# Patient Record
Sex: Male | Born: 1985 | Race: Black or African American | Hispanic: No | Marital: Single
Health system: Southern US, Community
[De-identification: ages and names within clinical notes are randomized; demographics above are authoritative.]

## PROBLEM LIST (undated history)

## (undated) DIAGNOSIS — M5020 Other cervical disc displacement, unspecified cervical region: Secondary | ICD-10-CM

## (undated) DIAGNOSIS — G8929 Other chronic pain: Secondary | ICD-10-CM

## (undated) DIAGNOSIS — M25552 Pain in left hip: Secondary | ICD-10-CM

## (undated) MED ORDER — ERGOCALCIFEROL (VITAMIN D2) 50,000 UNIT CAP
1250 mcg (50,000 unit) | ORAL_CAPSULE | ORAL | Status: DC
Start: ? — End: 2014-01-01

## (undated) MED ORDER — HYDROCODONE-ACETAMINOPHEN 5 MG-325 MG TAB
5-325 mg | ORAL_TABLET | Freq: Four times a day (QID) | ORAL | Status: DC | PRN
Start: ? — End: 2014-01-01

## (undated) MED ORDER — MELOXICAM 15 MG TAB
15 mg | ORAL_TABLET | Freq: Every day | ORAL | Status: DC
Start: ? — End: 2014-01-01

## (undated) MED ORDER — HYDROCODONE-ACETAMINOPHEN 5 MG-325 MG TAB
5-325 mg | ORAL_TABLET | ORAL | Status: DC | PRN
Start: ? — End: 2013-01-26

---

## 2012-02-01 NOTE — Progress Notes (Signed)
[facility] Physical Therapy Evaluation    [name] [name], 26 y.o., male presents today for evaluation.    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 He report pain after BM that lasts the rest of the day. BM 3-4x/day. Pt reports using different types of pain meds, suppositories and "some injections". Nothing has really helped. He currently takes percocet/ oxycodone to help manage the pain prn. Usually he takes daily.    Current urinary complaint  None    Bowel function:  Pain after BM    Pain complaint:  Rectal and anal pain    Pain scale:  Verbal Analog scale: average daily pain 5, best day 3, worst pain 8    Pain description:  daily: constant, after initial BM  worsens with: bowel movement, sitting  improves with: mildy with pain meds      Physical Exam      External trigger point/ muscle tenderness:  Pt with marked guarding, no definitive external TP's but will reassess     Postural assessment:  neutral sitting and standing posture- good alignment    Pelvic floor manual exam:  male-upon anal palpation of the pelvic floor, the following was noted: complete painful guarding response- minimal to no palpation tolerated    Focused with pt on isolation and release of pelvic floor spasm    Pelvic floor MMT  2 - partial elevation    Pelvic muscle release pattern  1 - poor release, no sensation of release    EMG Evaluation of pelvic floor musculature    Not performed at this visit    HEP- pt to use MH daily, begin focused downtraining and Level 1 stretches       Assessment    [name] [name] presents with the following findings for pelvic orthopedic and skeletal findings:  WNL posture, pelvic alignment and hip ROM    [name] [name] presents with the following findings for pelvic floor motor function:  Hypertonic pelvic floor with palpable trigger points, muscle spasm and poor functional motor control  Marked guarding pattern with palpation, likely from painful experiences  or anticipation of pain.      Physical Therapy Goals  ?? Patient education - patient will demonstrate verbal understanding of normal function of pelvic floor musculature, bowel/bladder habits and appropriate dietary and behavioral modifications.  ?? Patient will be independent in home therapeutic exercise program for pelvic muscle strengthening.  ?? Patient to demonstrate within normal limits pelvic floor relaxation for appropriate voiding on EMG - below 1 microvolt during void.  ?? Patient reports decrease of pain by 80- 100%.  ?? Patient will be independent in pain management techniques including appropriate therapeutic exercise, behavioral and dietary modifications and any home modality units.  ?? Patient will return to functional ADL's with decreased or no pain, including exercise, sexual activity and work functions.        Physical Therapy Plan of Treatment    Therapeutic exercise  Neuromotor re-education  Manual therapy  Progressive PME's  Modalities as needed for pain control ([place], Estim, Heat), neuromuscular electrical stimulation  Patient education and home program training  EMG assessment  Biofeedback training      Frequency:  once a week

## 2012-02-15 NOTE — Progress Notes (Signed)
Physical Therapy Treatment Note     Patient: [name] [name]    DOS: 02/15/2012    Subjective    Pt reports:  Sorry about the confusion last week  Was sore after initial visit- no changes other wise    Objective    Manual Therapy    internal trigger point release; soft tissue mobilization; myofascial release; scar massage: noted hypertonicity, painful spasm throughout pelvic floor  response to internal manual therapy: fair response, noted diminished spasm but patient still hypersensitive    Scar tissue and muscle spasm    Worked on contract and relax with focused release- pt to begin submax contractions 2-3 s with long release a few every 2-3 hours       Therapeutic exercise    Happy baby and childs pose        Modalities    moist heat with interferential electrical stimulation to: sacral spine and perineum      Patient education    Pt given home program education re: progressive PME's, pelvic muscle downtraining and "tone checks" daily, progressive therapeutic stretching and use of heat and cold for pain management       Assessment    Anal spasm and decreased tissue extensibility     Plan    Patient will continue with HEP, with above treatment modifications  Continue internal MT with focused release, progress PMEs    Follow-up Disposition: Not on File

## 2012-02-22 NOTE — Progress Notes (Signed)
Physical Therapy Treatment Note     Patient: [name] [name]    DOS: 02/22/2012    Subjective    Pt reports:  no significant changes  Felt a little better post treatment but once he has a BM the pain returns      Objective    Manual Therapy    internal trigger point release; soft tissue mobilization; myofascial release; scar massage: right sided hypertonicity, spasm greater than left  response to internal manual therapy: poor response, tissue remains tight and immobile      Therapeutic exercise    reviewed after modalities        EMG biofeedback/ Pelvic floor exercises    Not performed at this visit        Modalities    moist heat with interferential electrical stimulation to: sacral spine      Patient education    Pt given home program education re: pelvic muscle downtraining and "tone checks" daily       Assessment    good release when focusing during manual therapy     Plan    Patient will continue with HEP    Follow-up Disposition: Not on File

## 2012-05-18 NOTE — Telephone Encounter (Signed)
Patient cancelled appt for 08.19.13 unable to come that date has rsd to 09.16.13.

## 2012-12-17 MED ADMIN — ketorolac tromethamine (TORADOL) 60 mg/2 mL injection 60 mg: INTRAMUSCULAR | @ 10:00:00 | NDC 00409379601

## 2012-12-17 NOTE — ED Provider Notes (Signed)
HPI Comments: 27 yo M presents to the ED c/o left hip pain intermittently for a few months with worsening today.  Pt denies any falls or injury to the left hip.  Pt states that he has never had this pain in the past.  Pt took a tylenol and motrin at 0200.  Pt also states that he has mobic.  Pt denies fever, chills, N/V/D, CP, SOB, ABD pain, back pain, numbness, tingling, and any other sx or complaints.           History reviewed. No pertinent past medical history.     History reviewed. No pertinent past surgical history.      History reviewed. No pertinent family history.     History     Social History   ??? Marital Status: UNKNOWN     Spouse Name: N/A     Number of Children: N/A   ??? Years of Education: N/A     Occupational History   ??? Not on file.     Social History Main Topics   ??? Smoking status: Never Smoker    ??? Smokeless tobacco: Not on file   ??? Alcohol Use: No   ??? Drug Use: No   ??? Sexually Active: Not on file     Other Topics Concern   ??? Not on file     Social History Narrative   ??? No narrative on file                  ALLERGIES: Review of patient's allergies indicates no known allergies.      Review of Systems   Constitutional: Negative.  Negative for fever, chills and diaphoresis.   HENT: Negative.  Negative for ear pain, congestion, sore throat, trouble swallowing and neck pain.    Eyes: Negative.  Negative for photophobia, pain, redness and visual disturbance.   Respiratory: Negative.  Negative for cough, chest tightness, shortness of breath and wheezing.    Cardiovascular: Negative.  Negative for chest pain and palpitations.   Gastrointestinal: Negative.  Negative for nausea, vomiting, abdominal pain, diarrhea and blood in stool.   Genitourinary: Negative for dysuria and frequency.   Musculoskeletal: Negative.  Negative for back pain and joint swelling.        (+) left hip pain.   Skin: Negative.    Neurological: Negative.  Negative for seizures, syncope and headaches.   Psychiatric/Behavioral: Negative.   Negative for behavioral problems and confusion. The patient is not nervous/anxious.    All other systems reviewed and are negative.        Filed Vitals:    12/17/12 0552   BP: 138/98   Pulse: 89   Temp: 98.2 ??F (36.8 ??C)   Resp: 18   Height: 6\' 1"  (1.854 m)   Weight: 100.699 kg (222 lb)   SpO2: 98%            Physical Exam   Nursing note and vitals reviewed.  Constitutional: He appears well-developed and well-nourished. No distress.   HENT:   Head: Normocephalic and atraumatic.   Pulmonary/Chest: No stridor. No respiratory distress.   Musculoskeletal:        Left hip: He exhibits tenderness and bony tenderness. He exhibits normal range of motion, normal strength, no swelling, no crepitus and no deformity.        Lumbar back: He exhibits normal range of motion, no tenderness, no bony tenderness, no swelling, no pain and no spasm.  MDM     Differential Diagnosis; Clinical Impression; Plan:     X-ray negative will treat pain - no back pain      Procedures    -------------------------------------------------------------------------------------------------------------------     EKG INTERPRETATIONS:  None    RESULTS:   6:22 AM  X-RAY FINDINGS  A HIP LEFT AP/LAT MIN 2 VIEW X-Ray was ordered. Dr. Kirke Shaggy, MD's reading of this film is no acute process.       CONSULTATIONS:  None    PROGRESS NOTES:  5:56 AM:  Dr. Kirke Shaggy, MD answered the patient's questions regarding treatment.  6:22 AM:  Dr. Kirke Shaggy, MD reviewed radiology results and medications with the patient. The patient is feeling better. Dr. Kirke Shaggy, MD answered the patient's questions. The patient is ready to be discharged home.            Scribe Attestation:   written ZO:XWRUEAV Horton, (5:58 AM) scribing for and in the presence of Dr.Dasean Brow Helene Kelp, MD  ED Provider (5:58 AM).      I have personally performed the services described in this documentation, reviewed the documentation as recorded by the scribe in my  presence and it accurately and completely records my words and actions.  Lovett Calender, MD    -------------------------------------------------------------------------------------------------------------------    Diagnosis:   1. Left hip pain          Disposition: Home    Follow-up Information    Follow up With Details Comments Contact Info    [facility]  If symptoms worsen           Current Discharge Medication List      START taking these medications    Details   HYDROcodone-acetaminophen (NORCO) 5-325 mg per tablet Take 1 Tab by mouth every four (4) hours as needed for Pain.  Qty: 8 Tab, Refills: 0

## 2012-12-17 NOTE — ED Notes (Signed)
Left hip pain, hurts to walk, sit or lay down.  Pressure type pain.  Denies injury.  Pain has been on and off for a few months just worse today

## 2012-12-17 NOTE — ED Notes (Signed)
Patient armband removed and shredded  Reviewed discharge instructions with pt regarding hip pain; verbalized understanding.

## 2012-12-17 NOTE — ED Notes (Signed)
X-ray with arthritic changes greater trochanter - has coverage through [employer] system - instructed to f/u at [facility]

## 2013-01-10 NOTE — ED Provider Notes (Signed)
[facility]  EMERGENCY DEPARTMENT TREATMENT REPORT  NAME:  [name], [name]  SEX:   M  ADMIT: 01/10/2013  DOB:   [birth year]  MR#    [id number]  ROOM:    TIME SEEN: 12 30 PM  ACCT#  [id number]    cc: Lyna Poser MD, [employer] [facility]     TIME OF EVALUATION:  8:32 a.m.    PRIMARY CARE PHYSICIAN:  [employer].    CHIEF COMPLAINT:  Left hip pain.    HISTORY OF PRESENT ILLNESS:  A 27 year old male atraumatic left hip pain times 2 to 3 weeks.  The patient   states it hurts when he moves the hip in any direction, particularly with   weight-bearing, presents for further evaluation.    REVIEW OF SYSTEMS:  CONSTITUTIONAL:  No fevers.  MUSCULOSKELETAL:  Left hip pain.  No other joint pain or swelling.  No trauma   to the hip.  INTEGUMENTARY:  No rashes.   NEUROLOGICAL:  No headaches, sensory or motor symptoms.      PAST MEDICAL HISTORY:  Denies chronic illness.    SOCIAL HISTORY:  Nonsmoker, no drinking or IV drug use.    FAMILY HISTORY:  Noncontributory.    ALLERGIES:  PENICILLINS.    CURRENT MEDICATIONS:  None.    PHYSICAL EXAMINATION:  VITAL SIGNS:  Blood pressure 127/96, pulse 91, respirations 18, temperature   98.2 orally, pain 8 out of 10, O2 sats 100% on room air.  GENERAL APPEARANCE:  Patient appears well developed and well nourished.    Appearance and behavior are age and situation appropriate.   RESPIRATORY:  Clear and equal breath sounds.  No respiratory distress,   tachypnea, or accessory muscle use.     CARDIOVASCULAR:   Heart regular, without murmurs, gallops, rubs, or thrills.     Dorsalis pedis pulses 2+ and equal.  Calves soft, nontender.  GASTROINTESTINAL:  Abdomen soft, nontender, no complaint of pain to palpation.  MUSCULOSKELETAL:  Completely reproducible tenderness to palpation laterally   over area of greater trochanter of left femur with no anterior or posterior   tenderness in this area.  Range of motion is full without limitations    secondary to pain.  No other tenderness to palpation of lower extremity.  SKIN:  Warm and dry without rashes.   NEUROLOGIC:  Alert, oriented.  Sensation intact, motor strength equal and   symmetric.     INITIAL ASSESSMENT AND MANAGEMENT PLAN:  A 28 year old male with no trauma concerning for fracture, no fevers   concerning for infection.  No neurologic deficits.  I suspect the patient may   have a trochanteric bursitis, less likely would be meralgia paresthetica as   patient denies any history of wearing tight fitting pants.  The patient may   also have tenonitis.  The patient will need to follow up with an orthopedist   for further differentiation.  No evidence of acute abnormality at this time.    The patient is amenable to prescriptions as he needs to be able to drive   himself home today.    DIAGNOSTIC STUDIES:  None.    COURSE:  The patient stable in the Emergency Department, did not develop any other   symptoms, discharged in stable condition.    FINAL DIAGNOSIS:  Hip pain.    DISPOSITION AND TREATMENT PLAN:    Motrin or Percocet for pain.  Follow up with Dr. Lyna Poser for orthopedics.    Work note given.  The patient is  discharged home in stable condition, with   instructions to follow up with their regular doctor.  They are advised to   return immediately for any worsening or symptoms of concern. The patient was   personally evaluated by myself and Dr. Sherlon Handing who agrees with the above   assessment and plan.      ___________________  Wynelle Bourgeois MD  Dictated By: Mearl Latin. Lockie Pares, PA-C    My signature above authenticates this document and my orders, the final   diagnosis (es), discharge prescription (s), and instructions in the [facility]   Pul[facility]ck record.  [facility]  D:01/10/2013  T: 01/10/2013 13:11:03  [id number]  Authenticated by Wynelle Bourgeois, MD On 01/21/2013 02:38:05 AM

## 2013-01-26 NOTE — Patient Instructions (Addendum)
Heat is applied to the outer thigh for 15 to 20 minutes to prepare the area for stretching.     Trochanteric Bursitis: Exercises  Your Care Instructions  Here are some examples of typical rehabilitation exercises for your condition. Start each exercise slowly. Ease off the exercise if you start to have pain.  Your doctor or physical therapist will tell you when you can start these exercises and which ones will work best for you.  How to do the exercises  Hamstring wall stretch    1. Lie on your back in a doorway, with your good leg through the open door.  2. Slide your affected leg up the wall to straighten your knee. You should feel a gentle stretch down the back of your leg.  ?? Do not arch your back.  ?? Do not bend either knee.  ?? Keep one heel touching the floor and the other heel touching the wall. Do not point your toes.  3. Hold the stretch for at least 1 minute to begin. Then try to lengthen the time you hold the stretch to as long as 6 minutes.  4. Repeat 2 to 4 times.  If you do not have a place to do this exercise in a doorway, there is another way to do it:  1. Lie on your back, and bend the knee of your affected leg.  2. Loop a towel under the ball and toes of that foot, and hold the ends of the towel in your hands.  3. Straighten your knee, and slowly pull back on the towel. You should feel a gentle stretch down the back of your leg.  4. Hold the stretch for 15 to 30 seconds. Or even better, hold the stretch for 1 minute if you can.  5. Repeat 2 to 4 times.  Straight-leg raises to the outside    1. Lie on your side, with your affected leg on top.  2. Tighten the front thigh muscles of your top leg to keep your knee straight.  3. Keep your hip and your leg straight in line with the rest of your body, and keep your knee pointing forward. Do not drop your hip back.  4. Lift your top leg straight up toward the ceiling, about 12 inches off the floor. Hold for about 6 seconds, then slowly lower your leg.  5.  Repeat 8 to 12 times.  Clamshell    1. Lie on your side, with your affected leg on top and your head propped on a pillow. Keep your feet and knees together and your knees bent.  2. Raise your top knee, but keep your feet together. Do not let your hips roll back. Your legs should open up like a clamshell.  3. Hold for 6 seconds.  4. Slowly lower your knee back down. Rest for 10 seconds.  5. Repeat 8 to 12 times.  Standing quadriceps stretch    1. If you are not steady on your feet, hold on to a chair, counter, or wall. You can also lie on your stomach or your side to do this exercise.  2. Bend the knee of the leg you want to stretch, and reach behind you to grab the front of your foot or ankle with the hand on the same side. For example, if you are stretching your right leg, use your right hand.  3. Keeping your knees next to each other, pull your foot toward your buttock until you feel a gentle  stretch across the front of your hip and down the front of your thigh. Your knee should be pointed directly to the ground, and not out to the side.  4. Hold the stretch for 15 to 30 seconds.  5. Repeat 2 to 4 times.  Piriformis stretch    1. Lie on your back with your legs straight.  2. Lift your affected leg and bend your knee. With your opposite hand, reach across your body, and then gently pull your knee toward your opposite shoulder.  3. Hold the stretch for 15 to 30 seconds.  4. Repeat 2 to 4 times.  Double knee-to-chest    1. Lie on your back with your knees bent and your feet flat on the floor. You can put a small pillow under your head and neck if it is more comfortable.  2. Bring both knees to your chest.  3. Keep your lower back pressed to the floor. Hold for 15 to 30 seconds.  4. Relax, and lower your knees to the starting position.  5. Repeat 2 to 4 times.  Follow-up care is a key part of your treatment and safety. Be sure to make and go to all appointments, and call your doctor if you are having problems. It's  also a good idea to know your test results and keep a list of the medicines you take.   Where can you learn more?   Go to [email or web address]  Enter N503 in the search box to learn more about "Trochanteric Bursitis: Exercises."   ?? 2006-2014 [employer]. Care instructions adapted under license by [employer] (which disclaims liability or warranty for this information). This care instruction is for use with your licensed healthcare professional. If you have questions about a medical condition or this instruction, always ask your healthcare professional. [employer], [employer] disclaims any warranty or liability for your use of this information.  Content Version: 10.0.270728; Last Revised: October 29, 2010

## 2013-01-26 NOTE — Progress Notes (Signed)
History and Physical    Today's Date:  01/26/2013   Patient's Name: [name] [name]   Patient's DOB:  03-31-1986     History:     Chief Complaint   Patient presents with   ??? Establish Care   ??? Hip Pain     chronic for past 6 months     [name] [name] is a 27 y.o. male presenting for initial visit to establish care.  Primary care PCP is in the [employer] ([facility]). Last seen late last year. Will obtain records from previous provider to review. Care team updated on connect care on specialists seen.     Borderline blood pressure  This is a new problem. BP is borderline. Pt is not taking any bp medicines. Pt doesn't have a h/o hypertension.     Chronic left hip pain   This is a chronic problem. This is not controlled. Pain has been present since December 2013. Pt has been evaluated for this pain at [facility]. Not been seen by ortho. Pain is located in the lateral hip. It is intermittent. When it comes, it's present for three- four days. Pt takes naproxen and tylenol for it. It is not effective.     Past Medical History   Diagnosis Date   ??? Chronic left hip pain 01/26/2013     Past Surgical History   Procedure Laterality Date   ??? Hx gi       hemorrhoidectomy      reports that he has never smoked. He has never used smokeless tobacco. He reports that he does not drink alcohol or use illicit drugs.  Family History   Problem Relation Age of Onset   ??? Arthritis-osteo Mother      hip replacement needed     Allergies   Allergen Reactions   ??? Pcn (Penicillins) Rash     Problem List:      Patient Active Problem List   Diagnosis Code   ??? Chronic left hip pain 719.45, 338.29     Medications:     Current Outpatient Prescriptions   Medication Sig   ??? acetaminophen (TYLENOL) 325 mg tablet Take  by mouth every four (4) hours as needed for Pain.   ??? naproxen (NAPROSYN) 125 mg/5 mL suspension Take 125 mg by mouth two (2) times a day.   ??? HYDROcodone-acetaminophen (NORCO) 5-325 mg per tablet Take 1 Tab by mouth every four (4) hours as needed for Pain.     No  current facility-administered medications for this visit.     Review of Systems:   (Positives in bold)   General:   fevers, chills, generalized weakness, fatigue, weight change, night sweats, appetite change   Neurologic: dizziness, lightheadedness, headaches, loss of consciousness, numbness, tingling, focal weakness, speech change, confusion, memory loss, gait or balance difficulty    Eyes:  vision changes (blurry vision; not seen the eye doctor), double vision, pain with eye movement, photophobia, excessive tearing, dry eyes, redness, discharge  Ears:  change in hearing, ear pain, ear discharge, ear ringing  Nose:  nosebleeds, sneezing, runny nose, nasal congestion  Mouth/Throat: sore throat, voice change, dry mouth, difficulty swallowing  Neck:  pain, stiffness, swelling  Respiratory: dyspnea at rest, dyspnea on exertion, wheezing, cough, sputum production  Cardiovascular:   chest pain, palpitations, orthopnea, PND, pedal edema, leg cramps  Gastrointestinal:  nausea, vomiting, abdominal pain, constipation, diarrhea, heart burn, bloody stools, tarry black stools, rectal pain, hemorrhoids  Urinary: dysuria, hematuria, urinary frequency, nocturia, malodorous urine, difficulty  initiating flow, slow urine stream  Genital (M): penile discharge, ulcerations, rashes, erectile dysfunction  Musculoskeletal:  joint pain, joint stiffness, joint swelling, back pain, neck pain, decreased range of motion, focal muscle pain, diffuse myalgias  Psychiatric: abnormal mood swings, insomnia, anxiety, depression, hallucinations, suicidal ideation, homicidal ideation  Endocrine: polydipsia, polyuria, polyphagia, cold intolerance, heat intolerance, hot flashes, dry skin  Hematologic: easy bruising, easy bleeding  Dermatologic: Itching, rashes    Physical Assessment:   VS:  BP 140/80   Pulse 95   Temp(Src) 99 ??F (37.2 ??C) (Oral)   Resp 16   Ht 6\' 1"  (1.854 m)   Wt 211 lb (95.709 kg)   BMI 27.84 kg/m2  General:   Well-groomed,  well-nourished, in no distress, pleasant, alert, appropriate and conversant.   Eyes:    PERRL, EOMI.    Mouth:  MMM, good dentition, oropharynx WNL without membranes, exudates, petechiae or ulcers  Neck:   Neck supple, no swelling, mass or tenderness, no thyromegaly  Cardiovascular:   No JVD.  RRR, no MRG.    Pulmonary:   Lungs clear bilaterally.  Normal respiratory effort.  Abdomen:   Abdomen soft, NT, ND, NAB.  No masses or organomegaly.  Extremities:   No edema, LEs warm and well-perfused.  Neuro:   Alert and oriented, no focal deficits. No facial asymmetry noted.  Skin:    No rash or jaundice  MSK:   Normal ROM, 5/5 muscle strength, neg SLR test, +lateral hip pain, pain with internal and external rotation of the hip  Psych:  No pressured speech or abnormal thought content    Assessment/Plan & Orders:         ICD-9-CM    1. Routine general medical examination at a health care facility V70.0 CBC WITH AUTOMATED DIFF  Colon cancer: Colonoscopy due at age 3  Dyslipidemia: check fasting lipid panel and CMP  Diabetes mellitus: check FBG  Pneumococcal vaccine: due at age 13   Tdap: unknown  Herpes Zoster vaccine: due at age 55  Hep B vaccine: not indicated   Weight:  Body mass index is 27.84 kg/(m^2). Discussed the patient's BMI with him.  The BMI follow up plan is as follows: improve diet and exercise at least 30 min a day five times a week  Prostate cancer:  Discuss re: screening with PSA between ages 40 and 72   AAA:  One-time abdominal US if current or former smoker aged 72 to 68 years   Osteoporosis:  Check VIt D. No indication for dexa scan.      LIPID PANEL     METABOLIC PANEL, COMPREHENSIVE     THYROID CASCADE PROFILE     VITAMIN D, 25 HYDROXY   2. Chronic left hip pain 719.45 REFERRAL TO ORTHOPEDICS     REFERRAL TO PHYSICAL THERAPY     HYDROcodone-acetaminophen (NORCO) 5-325 mg per tablet     meloxicam (MOBIC) 15 mg tablet        Follow up in 1 month    [name] Sequeira F. Naysha Sholl, MD - Internal Medicine  01/26/2013, 3:49  PM  [facility]  [address] [place], [place] [address]  Phone [phone]  Fax [phone]

## 2013-01-29 NOTE — Telephone Encounter (Signed)
Called patient and told him to stop norco and to use the mobic that was sent to pharmacy. Patient verbalized understanding

## 2013-01-29 NOTE — Telephone Encounter (Signed)
Patient called stating he was having nausea and vomitting from norco and the mobic wasn't sent to pharmacy.

## 2013-01-29 NOTE — Addendum Note (Signed)
Addended by: Jule Economy on: 01/29/2013 08:18 AM     Modules accepted: Orders

## 2013-01-30 LAB — CBC WITH AUTOMATED DIFF
ABS. BASOPHILS: 0 10*3/uL (ref 0.0–0.2)
ABS. EOSINOPHILS: 0.1 10*3/uL (ref 0.0–0.4)
ABS. IMM. GRANS.: 0 10*3/uL (ref 0.0–0.1)
ABS. MONOCYTES: 0.2 10*3/uL (ref 0.1–0.9)
ABS. NEUTROPHILS: 1.9 10*3/uL (ref 1.4–7.0)
Abs Lymphocytes: 1.3 10*3/uL (ref 0.7–3.1)
BASOPHILS: 1 % (ref 0–3)
EOSINOPHILS: 2 % (ref 0–5)
HCT: 47.3 % (ref 37.5–51.0)
HGB: 16.1 g/dL (ref 12.6–17.7)
IMMATURE GRANULOCYTES: 0 % (ref 0–2)
Lymphocytes: 37 % (ref 14–46)
MCH: 31.3 pg (ref 26.6–33.0)
MCHC: 34 g/dL (ref 31.5–35.7)
MCV: 92 fL (ref 79–97)
MONOCYTES: 7 % (ref 4–12)
NEUTROPHILS: 53 % (ref 40–74)
PLATELET: 320 10*3/uL (ref 155–379)
RBC: 5.14 x10E6/uL (ref 4.14–5.80)
RDW: 14.4 % (ref 12.3–15.4)
WBC: 3.6 10*3/uL (ref 3.4–10.8)

## 2013-01-30 LAB — METABOLIC PANEL, COMPREHENSIVE
A-G Ratio: 1.4 (ref 1.1–2.5)
ALT (SGPT): 14 IU/L (ref 0–44)
AST (SGOT): 15 IU/L (ref 0–40)
Albumin: 4.7 g/dL (ref 3.5–5.5)
Alk. phosphatase: 64 IU/L (ref 39–117)
BUN/Creatinine ratio: 8 (ref 8–19)
BUN: 10 mg/dL (ref 6–20)
Bilirubin, total: 0.5 mg/dL (ref 0.0–1.2)
CO2: 23 mmol/L (ref 19–28)
Calcium: 10.1 mg/dL (ref 8.7–10.2)
Chloride: 101 mmol/L (ref 97–108)
Creatinine: 1.2 mg/dL (ref 0.76–1.27)
GFR est AA: 95 mL/min/{1.73_m2} (ref >59)
GFR est non-AA: 82 mL/min/{1.73_m2} (ref >59)
GLOBULIN, TOTAL: 3.4 g/dL (ref 1.5–4.5)
Glucose: 92 mg/dL (ref 65–99)
Potassium: 4.2 mmol/L (ref 3.5–5.2)
Protein, total: 8.1 g/dL (ref 6.0–8.5)
Sodium: 139 mmol/L (ref 134–144)

## 2013-01-30 LAB — LIPID PANEL
Cholesterol, total: 196 mg/dL (ref 100–199)
HDL Cholesterol: 50 mg/dL (ref >39)
LDL, calculated: 122 mg/dL — ABNORMAL HIGH (ref 0–99)
Triglyceride: 118 mg/dL (ref 0–149)
VLDL, calculated: 24 mg/dL (ref 5–40)

## 2013-01-30 LAB — CVD REPORT: PDF IMAGE: 0

## 2013-01-30 LAB — THYROID CASCADE PROFILE: TSH: 0.955 u[IU]/mL (ref 0.450–4.500)

## 2013-01-30 LAB — VITAMIN D, 25 HYDROXY: VITAMIN D, 25-HYDROXY: 17.6 ng/mL — ABNORMAL LOW (ref 30.0–100.0)

## 2013-02-01 NOTE — Progress Notes (Signed)
Quick Note:    Please call pt with results. Put in ergocalciferol 50,000 units qwk x 8 wks for him to pick up at his pharmacy. Thanks.  ______

## 2013-02-02 NOTE — Telephone Encounter (Signed)
Message copied by Lindaann Pascal on Fri Feb 02, 2013  1:28 PM  ------       Message from: Delila Pereyra F       Created: Thu Feb 01, 2013  8:24 AM         Please call pt with results.  Put in ergocalciferol 50,000 units qwk x 8 wks for him to pick up at his pharmacy.  Thanks.  ------

## 2013-02-02 NOTE — Telephone Encounter (Signed)
Called and left message for patient to call. Need tolet patient know that his vit d level low and Dr Pilar Grammes has sent ergocalciferol 50,000u to pharmacy to be taken one tab a week for 8 weeks.

## 2013-02-02 NOTE — ED Provider Notes (Signed)
[facility]  EMERGENCY DEPARTMENT TREATMENT REPORT  NAME:  [name] [name], [name] [name]  SEX:   M  ADMIT: 02/01/2013  DOB:   April 11, 1986  MR#    [id number]  ROOM:    TIME SEEN: 08 36 PM  ACCT#  [id number]        TIME OF SERVICE:  2010    CHIEF COMPLAINT:  Right shoulder pain.    HISTORY OF PRESENT ILLNESS:  The patient is a 27 year old male who presents complaining of right shoulder   and upper back pain which has been ongoing for the last 2 months.  The patient   states 2 months ago he did heavy weightlifting, lifting a 50-pound dumbbell   with his right arm, and states ever since then he has been having pain in his   shoulder and upper back.  He states the pain is exacerbated with movement.  He   did see his primary care physician 2 weeks after the incident and was told to   follow up which he has not done at this point.  He has been taking Tylenol   and Motrin with some relief of his pain.  He denies any numbness or tingling   to his right arm, any associated chest pain, shortness breath, or difficulty   breathing.    REVIEW OF SYSTEMS:  CONSTITUTIONAL:  No fever or chills.  MUSCULOSKELETAL:  Right shoulder and upper back pain status post heavy   lifting.  NEUROLOGIC:  No sensory or motor symptoms.    PAST MEDICAL HISTORY:  No medical illnesses.    CURRENT MEDICATIONS:  None.    KNOWN ALLERGIES:  PENICILLIN.    PHYSICAL EXAMINATION:  VITAL SIGNS:  Blood pressure 141/81, pulse 92, respirations 14, temperature   98.6 orally, pain 9 out of 10, O2 saturation 100% on room air.  GENERAL APPEARANCE:  The patient appears well-developed, well-nourished.  She   is alert, sitting comfortable on the examining bed.  NECK:  Supple, nontender, symmetrical, no masses or JVD, trachea midline,   thyroid not enlarged, nodular, or tender.  Full range of motion intact.  No   midline tenderness noted, no lymphadenopathy.  RESPIRATORY:  Clear and equal breath sounds.  No respiratory distress,   tachypnea, or accessory muscle use.     CARDIOVASCULAR:  Heart regular, without murmurs, gallops, rubs, or thrills.  MUSCULOSKELETAL:  Tenderness to palpation over the trapezius muscle on the   right side.  There is no midline tenderness over cervical, thoracic, lumbar,   sacral spine.  Full range of motion of neck and back are intact.  The patient   is able to flex and extend the right shoulder.  He does complain with   abduction of the right shoulder joint.  There is no other bony tenderness   otherwise along the right upper extremity.  Radial pulses 2+.  Nail beds are   pink with prompt capillary refill.  SKIN:  Area over right upper extremity warm, dry, and intact.  No evidence of   rashes, erythema or edema.  NEUROLOGIC:  Grip strength and light touch sensation of upper extremities   intact and symmetric bilaterally.    INITIAL ASSESSMENT:  A 27 year old male here for evaluation of right shoulder and right upper back   pain.  He does have reproducible tenderness over the right trapezius muscle.    I do suspect acute trapezius strain.  We will discharge the patient home with   Robaxin and naproxen.  I have  encouraged him to follow back up with his   primary care physician.  The patient otherwise had no bony tenderness over his   right upper extremity or over his midline over spine, and do not feel x-rays   are warranted, and he  is neurovascularly intact.    FINAL DIAGNOSES:  1.  Acute shoulder pain.  2.  Acute trapezius strain.    DISPOSITION:  The patient is stable for discharge home at this time.  The patient to follow   up with his primary care physician.  He was discharged home on Robaxin and   naproxen to be taken as directed.  The patient is encouraged to return should   he have any new or worsening symptoms.    The patient was personally evaluated by myself and Dr. Huntley Dec Tsuchitani who   agrees with the above assessment and plan.      ___________________  Tana Conch MD  Dictated By: Truddie Crumble. Cathlyn Parsons, PA-C     My signature above authenticates this document and my orders, the final   diagnosis (es), discharge prescription (s), and instructions in the [facility]   Pulsecheck record.  SD  D:02/01/2013  T: 02/02/2013 08:41:21  [id number]  Authenticated by Tana Conch, MD On 02/14/2013 03:10:18 PM

## 2013-02-02 NOTE — ED Provider Notes (Signed)
[facility]  EMERGENCY DEPARTMENT TREATMENT REPORT  NAME:  [name] [name], [name] [name]  SEX:   M  ADMIT: 02/01/2013  DOB:   [birth year]  MR#    [id number]  ROOM:    TIME SEEN: 08 56 PM  ACCT#  [id number]        ADDENDUM BY ASHLEY BLALOCK, PA-C     ADDENDUM:   Prior to discharge, the patient was requesting something stronger than Robaxin   and naproxen to go home with.  This injury is not a new injury.  It is old.    It has been over 2 months since the injury occurred, and he has been having   pain.  I have reviewed his prescription on the prescription monitoring   program.  He did just recently receive #30 Vicodin on 04/25.  He states the   Vicodin, however, makes him ill; however, he has had multiple Vicodin   prescriptions in the past which he has gotten filled, the most recent one was   he had #28 tablets in March alone.  He also received 16 Percocet at the   beginning of this month.  He has had multiple narcotic prescriptions filled   within the last several months.  I did not feel comfortable prescribing the   patient narcotics at this time and given that this injury is not an acute   injury, it is something that has been ongoing for a few months, I have   encouraged him to follow up with his primary care physician for further   evaluation and pain management.      ___________________  Tana Conch MD  Dictated By: Truddie Crumble. Cathlyn Parsons, PA-C    My signature above authenticates this document and my orders, the final   diagnosis (es), discharge prescription (s), and instructions in the [facility]   Pulsecheck record.  LH  D:02/01/2013  T: 02/02/2013 08:44:12  [id number]  Authenticated by Tana Conch, MD On 02/14/2013 03:10:25 PM

## 2013-02-07 NOTE — Telephone Encounter (Signed)
Left message for patient to call office.

## 2013-06-19 NOTE — Telephone Encounter (Signed)
Pt called wanting to schedule an appointment for his consult for a vas reversal. I told him my next consult appointment as of right now is November 3 at 1:00PM. Patient took that appointment. I told him that we ask for the $150.00 consult fee upfront due to most insurances not paying towards any of the procedure. I told him if he wanted to try to go through his insurance with the consult he would need to get a referral with [employer] prime. Patient stated he has [employer] standard. I told him we would collect the $150.00 but bill his insurance as a curiosity. Pt confirmed. He asked about how long does it take for him to get the procedure scheduled. I told him as of right now Dr. Esmeralda Arthur is out a little while due to the days that he goes to the facility fall on holidays in the upcoming months. I told him I do call [facility] to see if there is an extra spot I can fit a patient in to get the procedure done. Patient asked about the cost. I told him for [place] it is $3,000.00, anesthesia is around $1,200, and [facility] had just changed their price but in all it comes out to around $9,000.00. Patient asked about [employer] covering it. I told him we could call for the benefits, but most insurances do not cover this procedure. He was ok with that. Confirmed the date & time again.

## 2013-07-12 NOTE — Progress Notes (Signed)
Chart reviewed   I agree with physical therapy assessment and plan as outlined    VBrugh

## 2013-07-30 LAB — BLOOD TYPE, (ABO+RH)
ABO/Rh(D): O POS
ABO/Rh: O POS

## 2013-07-30 LAB — CBC WITH AUTOMATED DIFF
BASOPHILS: 0.6 % (ref 0–3)
EOSINOPHILS: 1.7 % (ref 0–5)
HCT: 42.8 % (ref 37.0–50.0)
HGB: 14.9 gm/dl (ref 12.4–17.2)
IMMATURE GRANULOCYTES: 0.9 % (ref 0.0–3.0)
LYMPHOCYTES: 19 % — ABNORMAL LOW (ref 28–48)
MCH: 30.9 pg (ref 23.0–34.6)
MCHC: 34.8 gm/dl (ref 30.0–36.0)
MCV: 88.8 fL (ref 80.0–98.0)
MONOCYTES: 5 % (ref 1–13)
MPV: 10.5 fL — ABNORMAL HIGH (ref 6.0–10.0)
NEUTROPHILS: 72.8 % — ABNORMAL HIGH (ref 34–64)
NRBC: 0 (ref 0–0)
PLATELET: 427 10*3/uL (ref 140–450)
RBC: 4.82 M/uL (ref 3.80–5.70)
RDW-SD: 41.3 (ref 35.1–43.9)
WBC: 5.4 10*3/uL (ref 4.0–11.0)

## 2013-07-30 LAB — METABOLIC PANEL, COMPREHENSIVE
ALT (SGPT): 32 U/L (ref 12–78)
AST (SGOT): 28 U/L (ref 15–37)
Albumin: 4 gm/dl (ref 3.4–5.0)
Alk. phosphatase: 60 U/L (ref 50–136)
BUN: 13 mg/dl (ref 7–25)
Bilirubin, total: 0.5 mg/dl (ref 0.2–1.0)
CO2: 30 mEq/L (ref 21–32)
Calcium: 9.7 mg/dl (ref 8.5–10.1)
Chloride: 103 mEq/L (ref 98–107)
Creatinine: 1.1 mg/dl (ref 0.6–1.3)
GFR est AA: >60
GFR est non-AA: >60
Glucose: 99 mg/dl (ref 74–106)
Potassium: 4.2 mEq/L (ref 3.5–5.1)
Protein, total: 8.5 gm/dl — ABNORMAL HIGH (ref 6.4–8.2)
Sodium: 137 mEq/L (ref 136–145)

## 2013-07-30 LAB — URINALYSIS W/ RFLX MICROSCOPIC
Bilirubin: NEGATIVE
Glucose: NEGATIVE mg/dl
Ketone: NEGATIVE mg/dl
Leukocyte Esterase: NEGATIVE
Nitrites: NEGATIVE
Protein: NEGATIVE mg/dl
Specific gravity: 1.01 (ref 1.005–1.030)
Urobilinogen: 0.2 mg/dl (ref 0.0–1.0)
pH (UA): 8 (ref 5.0–9.0)

## 2013-07-30 LAB — PROTHROMBIN TIME + INR
INR: 1 (ref 0.0–1.1)
Prothrombin time: 13.1 seconds (ref 11.5–14.0)

## 2013-07-30 LAB — ANTIBODY SCREEN: Antibody screen: NEGATIVE

## 2013-07-30 LAB — POC URINE MICROSCOPIC

## 2013-07-30 LAB — BLOOD TYPE CONFIRM.: ABO/Rh(D): O POS

## 2013-07-30 LAB — PTT: aPTT: 31.9 seconds (ref 24.9–35.6)

## 2014-01-01 MED ORDER — IBUPROFEN 800 MG TAB
800 mg | ORAL_TABLET | Freq: Three times a day (TID) | ORAL | Status: AC
Start: 2014-01-01 — End: 2014-01-06

## 2014-01-01 MED ORDER — HYDROCODONE-ACETAMINOPHEN 5 MG-325 MG TAB
5-325 mg | ORAL_TABLET | ORAL | Status: DC
Start: 2014-01-01 — End: 2014-01-02

## 2014-01-01 NOTE — ED Provider Notes (Signed)
Patient is a 28 y.o. male presenting with finger pain. The history is provided by the patient.   Finger Pain   This is a new problem. Pertinent negatives include no numbness.   pt was playing football this morning and jammed his right thumb.  C/o pain particularly on the dorsal aspect at the snuff box and worse w/flexion of the thumb.  Taking Tylenol and Ibuprofen throughout the day.    Denies numbness, weakness.  Left hand dominant.    Denies ETOH, tobacco, illicits.      Past Medical History   Diagnosis Date   ??? 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 Hx gi       hemorrhoidectomy         Family History   Problem Relation Age of Onset   ??? Arthritis-osteo Mother      hip replacement needed        History     Social History   ??? Marital Status: LEGALLY SEPARATED     Spouse Name: N/A     Number of Children: N/A   ??? Years of Education: N/A     Occupational History   ??? Not on file.     Social History Main Topics   ??? Smoking status: Never Smoker    ??? Smokeless tobacco: Never Used   ??? Alcohol Use: No   ??? Drug Use: No   ??? Sexual Activity:     Partners: Female     Pharmacist, hospital Protection: Condom     Other Topics Concern   ??? Not on file     Social History Narrative                  ALLERGIES: Pcn      Review of Systems   Musculoskeletal: Positive for arthralgias.   Skin: Positive for color change.   Neurological: Negative for weakness and numbness.   All other systems reviewed and are negative.      Filed Vitals:    01/01/14 1802   BP: 121/82   Pulse: 100   Temp: 98.8 ??F (37.1 ??C)   Resp: 20   Height: 6\' 1"  (1.854 m)   Weight: 86.183 kg (190 lb)   SpO2: 100%            Physical Exam   Constitutional: Vital signs are normal. He appears well-developed and well-nourished. He is active.  Non-toxic appearance. He does not appear ill. No distress.   HENT:   Head: Normocephalic and atraumatic.   Neck: Normal range of motion. Neck supple. Carotid bruit is not present. No tracheal  deviation present. No thyromegaly present.   Cardiovascular: Normal rate, regular rhythm, normal heart sounds and intact distal pulses.  Exam reveals no gallop and no friction rub.    No murmur heard.  Pulmonary/Chest: Effort normal and breath sounds normal. No stridor. No respiratory distress. He has no wheezes. He has no rales. He exhibits no tenderness.   Abdominal: Soft. He exhibits no distension and no mass. There is no tenderness. There is no rebound, no guarding and no CVA tenderness.   Musculoskeletal: Normal range of motion. He exhibits edema and tenderness.   RUE: FROM of right thumb (abduction and adduction and lateral rotation) but tremendous discomfort with Finkelstein maneuver.  Just proximal to the MCP joint there is TTP at the first MCP.  Hand strength otherwise 5/5.  Cap refill <2 sec.  No other areas of tenderness in the wrist or hand or finger.   Neurological: He is alert.   Skin: Skin is warm, dry and intact. He is not diaphoretic. No pallor.   Psychiatric: He has a normal mood and affect. His speech is normal and behavior is normal. Judgment and thought content normal.   Nursing note and vitals reviewed.       MDM  Number of Diagnoses or Management Options  Tenosynovitis of thumb:   Diagnosis management comments: Differential: tenosynovitis; tendinitis; fracture; sprain; contusion; dislocation    No definite bony injury seen on films.  Splint for comfort.  F/up with hand ortho.    Splint applied by tech to right wrist; excellent position.  N/v intact before and after application.         Amount and/or Complexity of Data Reviewed  Tests in the radiology section of CPT??: ordered and reviewed        Procedures    7:33 PM  Diagnosis:   1. Tenosynovitis of thumb          Disposition: home    Follow-up Information    Follow up With Details Comments Contact Info    Harvest ForestWendy F Alband, MD Schedule an appointment as soon as possible for a visit in 2 days  [address]  [facility]  [place] [id number]  [phone]      Italyhad R Manke, MD Schedule an appointment as soon as possible for a visit in 2 days  [address]                          c  [facility]  [place] [id number]  [phone]      [facility]  If symptoms worsen return immediately [address]  [place] [id number]  [phone]          Current Discharge Medication List      START taking these medications    Details   ibuprofen (MOTRIN) 800 mg tablet Take 1 Tab by mouth every eight (8) hours for 5 days.  Qty: 15 Tab, Refills: 0      HYDROcodone-acetaminophen (NORCO) 5-325 mg per tablet Take 1-2 tablets PO every 4-6 hours as needed for pain control.  If over the counter ibuprofen or acetaminophen was suggested, then only take the vicodin for pain not well controlled with the over the counter medication.  Qty: 12 Tab, Refills: 0

## 2014-01-01 NOTE — ED Notes (Signed)
I have reviewed discharge instructions with the patient.  The patient verbalized understanding. Patient in stable condition at discharge. Patient armband removed and shredded

## 2014-01-01 NOTE — ED Notes (Signed)
PT. C/o R thumb pain - pt. Jammed thumb while playing football this morning.

## 2014-01-02 MED ORDER — PROMETHAZINE 25 MG TAB
25 mg | ORAL_TABLET | Freq: Four times a day (QID) | ORAL | Status: DC | PRN
Start: 2014-01-02 — End: 2014-01-12

## 2014-01-02 MED ORDER — TRAMADOL 50 MG TAB
50 mg | ORAL | Status: AC
Start: 2014-01-02 — End: 2014-01-02

## 2014-01-02 MED ORDER — PROMETHAZINE 25 MG TAB
25 mg | ORAL | Status: AC
Start: 2014-01-02 — End: 2014-01-02

## 2014-01-02 MED ORDER — TRAMADOL 50 MG TAB
50 mg | ORAL_TABLET | Freq: Four times a day (QID) | ORAL | Status: DC | PRN
Start: 2014-01-02 — End: 2014-01-12

## 2014-01-02 MED ADMIN — promethazine (PHENERGAN) 25 mg tablet: ORAL | @ 16:00:00 | NDC 68084015511

## 2014-01-02 MED ADMIN — traMADol (ULTRAM) 50 mg tablet: ORAL | @ 16:00:00 | NDC 51079099101

## 2014-01-02 MED FILL — PROMETHAZINE 25 MG TAB: 25 mg | ORAL | Qty: 1

## 2014-01-02 MED FILL — TRAMADOL 50 MG TAB: 50 mg | ORAL | Qty: 1

## 2014-01-02 NOTE — ED Provider Notes (Signed)
HPI Comments: [name] [name] is a 28 y.o. Male with no pertinent PMHx who presents to the ED c/o medication problem. Pt was here last night after jamming his right thumb. Pt given hydrocodone and motrin for pain. States after taking the hydrocodone he vomited and is not feeling well. Pt requesting different pain medication. Pt denies any SOB, CP, dizziness, sore throat, itching, facial swelling. No other complaints expressed at this time.    Patient is a 28 y.o. male presenting with medication problem.   Medication Problem   Associated symptoms include nausea and vomiting. Pertinent negatives include no confusion and no shortness of breath.        Past Medical History   Diagnosis Date   ??? 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 Hx gi       hemorrhoidectomy         Family History   Problem Relation Age of Onset   ??? Arthritis-osteo Mother      hip replacement needed        History     Social History   ??? Marital Status: LEGALLY SEPARATED     Spouse Name: N/A     Number of Children: N/A   ??? Years of Education: N/A     Occupational History   ??? Not on file.     Social History Main Topics   ??? Smoking status: Never Smoker    ??? Smokeless tobacco: Never Used   ??? Alcohol Use: No   ??? Drug Use: No   ??? Sexual Activity:     Partners: Female     Pharmacist, hospital Protection: Condom     Other Topics Concern   ??? Not on file     Social History Narrative          ALLERGIES: Pcn      Review of Systems   Constitutional: Negative.  Negative for fever, chills and diaphoresis.   HENT: Negative.  Negative for congestion, ear pain, sore throat and trouble swallowing.    Eyes: Negative.  Negative for photophobia, pain, redness and visual disturbance.   Respiratory: Negative.  Negative for cough, chest tightness, shortness of breath and wheezing.    Cardiovascular: Negative.  Negative for chest pain and palpitations.   Gastrointestinal: Positive for nausea and vomiting. Negative for abdominal pain,  diarrhea and blood in stool.   Genitourinary: Negative for dysuria and frequency.   Musculoskeletal: Negative.  Negative for back pain, joint swelling and neck pain.   Skin: Negative.  Negative for rash.   Neurological: Negative.  Negative for seizures, syncope and headaches.   Psychiatric/Behavioral: Negative.  Negative for behavioral problems and confusion. The patient is not nervous/anxious.    All other systems reviewed and are negative.      Filed Vitals:    01/02/14 1142   BP: 146/100   Pulse: 92   Temp: 98.2 ??F (36.8 ??C)   Resp: 14   SpO2: 100%            Physical Exam   Constitutional: He is oriented to person, place, and time. He appears well-developed and well-nourished. No distress.   HENT:   Head: Normocephalic and atraumatic.   Mouth/Throat: Uvula is midline and oropharynx is clear and moist. No oropharyngeal exudate, posterior oropharyngeal edema, posterior oropharyngeal erythema or tonsillar abscesses.   No throat swelling   Eyes: Conjunctivae are normal. Pupils are equal,  round, and reactive to light. No scleral icterus.   Neck: Normal range of motion. Neck supple. No tracheal deviation present.   Cardiovascular: Normal rate, regular rhythm, normal heart sounds and intact distal pulses.    Pulmonary/Chest: Effort normal and breath sounds normal. No respiratory distress. He has no decreased breath sounds. He has no wheezes. He has no rhonchi. He has no rales.   Abdominal: Soft. Bowel sounds are normal. He exhibits no distension. There is no tenderness.   Musculoskeletal: Normal range of motion. He exhibits no edema.   Lymphadenopathy:     He has no cervical adenopathy.   Neurological: He is alert and oriented to person, place, and time. No cranial nerve deficit.   Skin: Skin is warm and dry. No rash noted. He is not diaphoretic.   Psychiatric: He has a normal mood and affect.   Nursing note and vitals reviewed.       MDM  Number of Diagnoses or Management Options  Medication reaction, initial  encounter:   Nausea with vomiting:   Diagnosis management comments: Vomiting after taking norco. Will give dose phenergan and tramadol.    Dc home with Rx phenergan, tramadol. Stop norco.    Risk of Complications, Morbidity, and/or Mortality  Presenting problems: minimal  Diagnostic procedures: minimal  Management options: minimal    Patient Progress  Patient progress: stable      Procedures    -------------------------------------------------------------------------------------------------------------------     EKG INTERPRETATIONS:  none    RADIOLOGY RESULTS:   No orders to display       LABORATORY RESULTS:  No results found for this or any previous visit (from the past 12 hour(s)).    ED Objective Order Summary:     Orders Placed This Encounter   ??? traMADol (ULTRAM) 50 mg tablet     Sig: Take 1 Tab by mouth every six (6) hours as needed for Pain. Max Daily Amount: 200 mg.     Dispense:  12 Tab     Refill:  0   ??? promethazine (PHENERGAN) 25 mg tablet     Sig: Take 1 Tab by mouth every six (6) hours as needed.     Dispense:  12 Tab     Refill:  0       CONSULTATIONS:  none    PROGRESS NOTES:  11:40 AM:  Dr. Ewing SchleinAdrian C Kenyia Wambolt, DO answered the patient's questions regarding treatment. Pt stable and ready for discharge.     DISPOSITION:  ED DIAGNOSIS & DISPOSITION INFORMATION  Diagnosis:   1. Medication reaction, initial encounter    2. Nausea with vomiting          Disposition: HOME    Follow-up Information    Follow up With Details Comments Contact Info    Harvest ForestWendy F Alband, MD Schedule an appointment as soon as possible for a visit in 2 days RETURN TO ER IMMEDIATELY IF ANY WORSENING SYMPTOMS [address]  [facility]  [place] [id number]  [phone]            Current Discharge Medication List      START taking these medications    Details   traMADol (ULTRAM) 50 mg tablet Take 1 Tab by mouth every six (6) hours as needed for Pain. Max Daily Amount: 200 mg.  Qty: 12 Tab, Refills: 0       promethazine (PHENERGAN) 25 mg tablet Take 1 Tab by mouth every six (6) hours as needed.  Qty: 12 Tab, Refills: 0         CONTINUE these medications which have NOT CHANGED    Details   ibuprofen (MOTRIN) 800 mg tablet Take 1 Tab by mouth every eight (8) hours for 5 days.  Qty: 15 Tab, Refills: 0         STOP taking these medications       HYDROcodone-acetaminophen (NORCO) 5-325 mg per tablet Comments:   Reason for Stopping:               SCRIBE ATTESTATION STATEMENT:  Documented by: Edward Qualia scribing for and in the presence of Ewing Schlein, DO.    PROVIDER ATTESTATION STATEMENT:  I personally performed the services described in the documentation, reviewed the documentation, as recorded by the scribe in my presence, and it accurately and completely records my words and actions Ewing Schlein, DO.

## 2014-01-04 NOTE — Progress Notes (Signed)
Writer called patient's listed home phone. Male answered phone stated "No speak English" and hung up.

## 2014-01-08 NOTE — ED Provider Notes (Addendum)
Patient is a 28 y.o. male presenting with dental problem. The history is provided by the patient. History limited by: no communication barrier.   Dental Pain   This is a new problem. The current episode started yesterday. The problem occurs constantly. The problem has not changed since onset.The pain is located in the left upper mouth. The pain is moderate.  Treatments tried: Pt is on 10 mg Percocet - states it is not controlling his pain.  The patient has no cardiac history.       Past Medical History   Diagnosis Date   ??? 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 Hx gi       hemorrhoidectomy         Family History   Problem Relation Age of Onset   ??? Arthritis-osteo Mother      hip replacement needed        History     Social History   ??? Marital Status: LEGALLY SEPARATED     Spouse Name: N/A     Number of Children: N/A   ??? Years of Education: N/A     Occupational History   ??? Not on file.     Social History Main Topics   ??? Smoking status: Never Smoker    ??? Smokeless tobacco: Never Used   ??? Alcohol Use: No   ??? Drug Use: No   ??? Sexual Activity:     Partners: Female     Pharmacist, hospitalBirth Control/ Protection: Condom     Other Topics Concern   ??? Not on file     Social History Narrative                  ALLERGIES: Pcn      Review of Systems   Constitutional: Negative.    HENT: Positive for dental problem.    Eyes: Negative.    Respiratory: Negative.    Cardiovascular: Negative.    Gastrointestinal: Negative.    Endocrine: Negative.    Genitourinary: Negative.    Musculoskeletal: Negative.    Skin: Negative.    Allergic/Immunologic: Negative.    Neurological: Negative.    Psychiatric/Behavioral: Negative.        Filed Vitals:    01/08/14 0618   BP: 140/95   Pulse: 88   Temp: 98.5 ??F (36.9 ??C)   Resp: 16   Height: 6\' 1"  (1.854 m)   Weight: 86.183 kg (190 lb)   SpO2: 99%            Physical Exam   Constitutional: He is oriented to person, place, and time. He appears well-developed and  well-nourished. No distress.   HENT:   Head: Normocephalic and atraumatic.   Teeth 15 and 16 are extracted.  No dry socket bleeding or abscess.     Eyes: Pupils are equal, round, and reactive to light.   Neck: Normal range of motion. Neck supple.   Cardiovascular: Normal rate and regular rhythm.    Pulmonary/Chest: Effort normal and breath sounds normal.   Abdominal: Soft. Bowel sounds are normal. There is no tenderness. There is no rebound.   Genitourinary:   NE   Musculoskeletal: Normal range of motion.   Neurological: He is alert and oriented to person, place, and time.   Skin: Skin is warm and dry.   Psychiatric: He has a normal mood and affect.   Nursing note and vitals  reviewed.       MDM  Number of Diagnoses or Management Options  Elevated blood pressure:   Face pain:   Pain, dental:   Diagnosis management comments: MDM:  Pt states he plans on going back to his dentist, which opens at 1000 today.  Katherine Roan, NP  6:35 AM          Procedures    PROGRESS NOTE:  One or more blood pressure readings were noted elevated during the Pt's presentation in the emergency department this date.  This abnormal reading has been cited in the Pt's diagnosis, and they have been encouraged to follow up with their primary care physician, or referred to a consultant for further evaluation and treatment.    Katherine Roan, NP    Diagnosis:   1. Face pain    2. Pain, dental    3. Elevated blood pressure          Disposition:  Discharged to Home.     Follow-up Information    Follow up With Details Comments Contact Info     Call YOUR DENTIST AS PLANNED.            Patient's Medications   Start Taking    No medications on file   Continue Taking    OXYCODONE-ACETAMINOPHEN (PERCOCET) 10-325 MG PER TABLET    Take 1 Tab by mouth.    PROMETHAZINE (PHENERGAN) 25 MG TABLET    Take 1 Tab by mouth every six (6) hours as needed.    TRAMADOL (ULTRAM) 50 MG TABLET    Take 1 Tab by mouth every six (6) hours as needed for Pain. Max Daily Amount: 200  mg.   These Medications have changed    No medications on file   Stop Taking    No medications on file     I was personally available for consultation in the emergency department.  I have not seen the patient but have reviewed the chart prior to discharge and agree with the documentation recorded by the [facility], including the assessment, treatment plan, and disposition.  Toshiko Kemler Marlene Lard, MD

## 2014-01-08 NOTE — ED Notes (Signed)
Tooth extracted yesterday left upper jaw; pt unable to control the pain with Percocet 10 mg po given by dentist. No swelling noted.

## 2014-01-09 NOTE — Progress Notes (Signed)
Patient has had 3 ER visits in 8 days.  Writer called patient's listed home phone number again. Male voice stated "no..no speak English".  Writer attempted to contact listed emergency contact, "voicemail full".  No further calls scheduled.

## 2014-01-12 NOTE — ED Notes (Signed)
Pt resting on bed in NAD
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
## 2014-01-12 NOTE — ED Notes (Signed)
I have reviewed discharge instructions with the patient.  The patient verbalized understanding.Patient armband removed and shredded

## 2014-01-13 MED ORDER — HYDROCODONE-ACETAMINOPHEN 5 MG-325 MG TAB
5-325 mg | ORAL_TABLET | ORAL | Status: DC
Start: 2014-01-13 — End: 2014-01-18

## 2014-01-13 MED ORDER — GABAPENTIN 300 MG CAP
300 mg | ORAL_CAPSULE | Freq: Three times a day (TID) | ORAL | Status: DC
Start: 2014-01-13 — End: 2014-02-06

## 2014-01-13 MED ORDER — NAPROXEN 500 MG TAB
500 mg | ORAL_TABLET | Freq: Two times a day (BID) | ORAL | Status: AC
Start: 2014-01-13 — End: 2014-01-22

## 2014-01-14 MED ORDER — PREDNISONE 20 MG TAB
20 mg | ORAL_TABLET | Freq: Every day | ORAL | Status: AC
Start: 2014-01-14 — End: 2014-01-19

## 2014-01-14 MED ORDER — KETOROLAC TROMETHAMINE 10 MG TAB
10 mg | ORAL_TABLET | Freq: Three times a day (TID) | ORAL | Status: DC | PRN
Start: 2014-01-14 — End: 2014-02-06
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
## 2014-01-14 NOTE — ED Notes (Signed)
My right arm is still numb and was seen here on Saturday. Dr. Edilia Boickson saw me. I have an appointment on Thursday but I want something done now

## 2014-01-14 NOTE — ED Notes (Signed)
I have reviewed discharge instructions with the patient.  The patient verbalized understanding. Patient armband removed and shredded.
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
## 2014-01-14 NOTE — ED Notes (Signed)
M. Theis, PA at bedside to assess pt.

## 2014-01-18 MED ORDER — HYDROCHLOROTHIAZIDE 25 MG TAB
25 mg | ORAL_TABLET | Freq: Every day | ORAL | Status: DC
Start: 2014-01-18 — End: 2014-02-06

## 2014-01-18 MED ORDER — OXYCODONE-ACETAMINOPHEN 5 MG-325 MG TAB
5-325 mg | ORAL_TABLET | ORAL | Status: DC | PRN
Start: 2014-01-18 — End: 2014-02-06

## 2014-01-18 NOTE — Progress Notes (Signed)
Learning Assessment (baseline): Completed  Depression Screening: Completed  Fall Risk Screening: n/a  Abuse screening: n/a  ADL Assessment: n/a    1. Have you been to the ER, urgent care clinic since your last visit?  Hospitalized since your last visit?No    2. Have you seen or consulted any other health care providers outside of the [facility] since your last visit?  Include any pap smears or colon screening. 03/21/13 MRI CT imaging of cervical spine

## 2014-01-18 NOTE — Progress Notes (Signed)
Internal Medicine Progress Note    Today's Date:  01/21/2014   Patient:  [name] [name]  Patient DOB:  1986-08-23    Subjective:     Chief Complaint   Patient presents with   ??? Neck Pain   ??? Tingling     from righ arm to hand      Hypertension   This is a chronic problem, new to me. BP is not at goal. Pt takes no medication. Pt has had elevated blood pressures on more than one visit.     Neck pain   This is a chronic problem. This is not controlled. Pain was seen for this in the ED. + right radial arm numbness/tingling. MRI shows disk herniation at C6-C7.  Cervical disk fusion is planned in May.  Surgeon is Dr. Lisette Grinderarlson.      Past Medical History   Diagnosis Date   ??? Chronic left hip pain 01/26/2013   ??? Hypertension 01/18/2014   ??? Cervical herniated disc 01/18/2014     Past Surgical History   Procedure Laterality Date   ??? Hx gi       hemorrhoidectomy      reports that he has never smoked. He has never used smokeless tobacco. He reports that he does not drink alcohol or use illicit drugs.  Family History   Problem Relation Age of Onset   ??? Arthritis-osteo Mother      hip replacement needed     Allergies   Allergen Reactions   ??? Pcn [Penicillins] Rash     Review of Systems   Positives in bold  CV:      chest pain, palpitations, orthopnea, PND  PULM: SOB, wheezing, cough, sputum production, hemoptysis    Current Outpatient Meds and Allergies     Current Outpatient Prescriptions on File Prior to Visit   Medication Sig Dispense Refill   ??? ketorolac (TORADOL) 10 mg tablet Take 1 Tab by mouth three (3) times daily as needed for Pain. Do not take for more than 5 days in a row. Max 4 pills per day.  20 Tab  0   ??? gabapentin (NEURONTIN) 300 mg capsule Take 1 Cap by mouth three (3) times daily.  90 Cap  0   ??? naproxen (NAPROSYN) 500 mg tablet Take 1 Tab by mouth two (2) times daily (with meals) for 10 days.  20 Tab  0     No current facility-administered medications on file prior to visit.     Allergies   Allergen Reactions   ??? Pcn  [Penicillins] Rash     Objective:     VS:  BP 146/94   Pulse 90   Temp(Src) 97.9 ??F (36.6 ??C) (Oral)   Resp 16   Ht 6\' 1"  (1.854 m)   Wt 203 lb 9.6 oz (92.352 kg)   BMI 26.87 kg/m2   SpO2 98%  General:   Well-nourished, well-groomed, pleasant, alert, in no acute distress  Head:  Normocephalic, atraumatic  Ears:  External ears WNL  Nose:  External nares WNL  Cardiovascular:   Regular rate and rhythm, no murmurs, no rubs, no gallops  Pulmonary:   Clear breath sounds bilaterally, good air movement, no wheezing, rales or rhonchi, normal respiratory effort  Abdomen:   Abdomen soft, nontender, nondistended, NABS  Extremities:   No edema, warm and well-perfused  Neuro:   Alert, conversant, appropriate, following commands, no focal deficits.   Psych:  No pressured speech, no abnormal thought content  MRI cervical spine: herniation at C6-C7    Assessment/Plan & Orders:         ICD-9-CM    1. Essential hypertension 401.9 hydrochlorothiazide (HYDRODIURIL) 25 mg tablet   2. Cervical herniated disc 722.0 oxyCODONE-acetaminophen (PERCOCET) 5-325 mg per tablet   3. Screening V82.9 METABOLIC PANEL, COMPREHENSIVE     TSH+FREE T4     LIPID PANEL     CBC WITH AUTOMATED DIFF     Follow-up Disposition:  Return in about 1 week (around 01/25/2014) for Blood pressure check, Nurse visit.    *Patient verbalized understanding and agreement with the plan.  Patient was given an after-visit summary.    Fonnie MuWendy F. Clora Ohmer, MD - Internal Medicine  01/21/2014, 1:56 PM  [facility]  [address]  [place], [place] [id number]  Phone [phone]  Fax [phone]

## 2014-01-21 NOTE — Patient Instructions (Signed)
Elevated Blood Pressure: After Your Visit  Your Care Instructions     Blood pressure is a measure of how hard the blood pushes against the walls of your arteries. It's normal for blood pressure to go up and down throughout the day. But if it stays up over time, you have high blood pressure.  Two numbers tell you your blood pressure. The first number is the systolic pressure. It shows how hard the blood pushes when your heart is pumping. The second number is the diastolic pressure. It shows how hard the blood pushes between heartbeats, when your heart is relaxed and filling with blood. An ideal blood pressure in adults is less than 120/80 (say "120 over 80"). High blood pressure is 140/90 or higher.  The main test for high blood pressure is simple, fast, and painless. To diagnose high blood pressure, your doctor will test your blood pressure at different times. You may have to check your blood pressure at home if there is reason to think that the results in the doctor's office aren't accurate.  If you are diagnosed with high blood pressure, you can work with your doctor to make a long-term plan to manage it.  Follow-up care is a key part of your treatment and safety. Be sure to make and go to all appointments, and call your doctor if you are having problems. It's also a good idea to know your test results and keep a list of the medicines you take.  How can you care for yourself at home?  ?? Do not smoke. Smoking increases your risk for heart attack and stroke. If you need help quitting, talk to your doctor about stop-smoking programs and medicines. These can increase your chances of quitting for good.  ?? Stay at a healthy weight.  ?? Try to limit how much sodium you eat to less than 2,300 milligrams (mg) a day. Your doctor may ask you to try to eat less than 1,500 mg a day.  ?? Be physically active. Get at least 30 minutes of exercise on most days of the week. Walking is a good choice. You also may want to do other  activities, such as running, swimming, cycling, or playing tennis or team sports.  ?? Avoid or limit alcohol. Talk to your doctor about whether you can drink any alcohol.  ?? Eat plenty of fruits, vegetables, and low-fat dairy products. Eat less saturated and total fats.  ?? Learn how to check your blood pressure at home.  When should you call for help?  Call your doctor now or seek immediate medical care if:  ?? Your blood pressure is much higher than normal (such as 180/110 or higher).  ?? You think high blood pressure is causing symptoms such as:  ?? Severe headache.  ?? Blurry vision.  Watch closely for changes in your health, and be sure to contact your doctor if:  ?? You do not get better as expected.   Where can you learn more?   Go to http://www.healthwise.net/[facility]  Enter F644 in the search box to learn more about "Elevated Blood Pressure: After Your Visit."   ?? 2006-2015 Healthwise, Incorporated. Care instructions adapted under license by [facility] [place] (which disclaims liability or warranty for this information). This care instruction is for use with your licensed healthcare professional. If you have questions about a medical condition or this instruction, always ask your healthcare professional. Healthwise, Incorporated disclaims any warranty or liability for your use of this information.  Content Version:   10.4.390249; Current as of: December 26, 2012

## 2014-01-23 LAB — METABOLIC PANEL, COMPREHENSIVE
A-G Ratio: 1.5 (ref 1.1–2.5)
ALT (SGPT): 16 IU/L (ref 0–44)
AST (SGOT): 20 IU/L (ref 0–40)
Albumin: 5.1 g/dL (ref 3.5–5.5)
Alk. phosphatase: 53 IU/L (ref 39–117)
BUN/Creatinine ratio: 11 (ref 8–19)
BUN: 13 mg/dL (ref 6–20)
Bilirubin, total: 0.5 mg/dL (ref 0.0–1.2)
CO2: 26 mmol/L (ref 18–29)
Calcium: 10.6 mg/dL — ABNORMAL HIGH (ref 8.7–10.2)
Chloride: 97 mmol/L (ref 97–108)
Creatinine: 1.18 mg/dL (ref 0.76–1.27)
GFR est AA: 97 mL/min/{1.73_m2} (ref >59)
GFR est non-AA: 83 mL/min/{1.73_m2} (ref >59)
GLOBULIN, TOTAL: 3.4 g/dL (ref 1.5–4.5)
Glucose: 88 mg/dL (ref 65–99)
Potassium: 5.1 mmol/L (ref 3.5–5.2)
Protein, total: 8.5 g/dL (ref 6.0–8.5)
Sodium: 142 mmol/L (ref 134–144)

## 2014-01-23 LAB — TSH+FREE T4
Free T4: 1.1 ng/dL
TSH-ICMA: 2.1 uU/mL

## 2014-01-23 LAB — CBC WITH AUTOMATED DIFF
ABS. BASOPHILS: 0 10*3/uL (ref 0.0–0.2)
ABS. EOSINOPHILS: 0.1 10*3/uL (ref 0.0–0.4)
ABS. IMM. GRANS.: 0 10*3/uL (ref 0.0–0.1)
ABS. MONOCYTES: 0.4 10*3/uL (ref 0.1–0.9)
ABS. NEUTROPHILS: 2.8 10*3/uL (ref 1.4–7.0)
Abs Lymphocytes: 1.9 10*3/uL (ref 0.7–3.1)
BASOPHILS: 0 %
EOSINOPHILS: 1 %
HCT: 46.7 % (ref 37.5–51.0)
HGB: 15.5 g/dL (ref 12.6–17.7)
IMMATURE GRANULOCYTES: 0 %
Lymphocytes: 37 %
MCH: 31.3 pg (ref 26.6–33.0)
MCHC: 33.2 g/dL (ref 31.5–35.7)
MCV: 94 fL (ref 79–97)
MONOCYTES: 8 %
NEUTROPHILS: 54 %
PLATELET: 391 10*3/uL — ABNORMAL HIGH (ref 150–379)
RBC: 4.95 x10E6/uL (ref 4.14–5.80)
RDW: 14.5 % (ref 12.3–15.4)
WBC: 5.2 10*3/uL (ref 3.4–10.8)

## 2014-01-23 LAB — LIPID PANEL
Cholesterol, total: 214 mg/dL — ABNORMAL HIGH (ref 100–199)
HDL Cholesterol: 59 mg/dL (ref >39)
LDL, calculated: 127 mg/dL — ABNORMAL HIGH (ref 0–99)
Triglyceride: 139 mg/dL (ref 0–149)
VLDL, calculated: 28 mg/dL (ref 5–40)

## 2014-01-23 LAB — CVD REPORT

## 2014-02-06 NOTE — ED Notes (Signed)
Right upper molar pain x 3 days.  States he has no Education officer, communitydentist.

## 2014-02-06 NOTE — ED Notes (Signed)
Patient (s)  given copy of dc instructions and 3 script(s).  Patient (s)  verbalized understanding of instructions and script (s).  Patient given a current medication reconciliation form and verbalized understanding of their medications.   Patient (s) verbalized understanding of the importance of discussing medications with  his or her physician or clinic they will be following up with.  Patient alert and oriented and in no acute distress.  Patient discharged home ambulatory with self.

## 2014-02-06 NOTE — ED Provider Notes (Signed)
HPI Comments: 8:58 PM  28 y.o. male presents to the ED C/O worsening right upper molar pain onset 4 days ago. Pt reports he is only able to chew on the right side due to left-sided dental extractions, so his teeth have been bothering him for a while. Pt is allergic to Pcn. Pt reports he does not have a dentist because he just got out of the [employer]. Pt denies gum drainage, and any other sxs or complaints at this time.    Patient is a 28 y.o. male presenting with dental problem. The history is provided by the patient. No language interpreter was used.   Dental Pain   This is a new problem. The current episode started more than 2 days ago (4 days ago). The problem has been gradually worsening. The pain is located in the right upper mouth. There was no vomiting, no nausea, no swelling, no gum redness and no drainage. He has tried nothing for the symptoms.   Written By Cipriano Mile, ED Scribe, as dictated by Romero Liner, PA-C.       Past Medical History   Diagnosis Date   ??? Chronic left hip pain 01/26/2013   ??? Hypertension 01/18/2014   ??? Cervical herniated disc 01/18/2014        Past Surgical History   Procedure Laterality Date   ??? Hx gi       hemorrhoidectomy         Family History   Problem Relation Age of Onset   ??? Arthritis-osteo Mother      hip replacement needed        History     Social History   ??? Marital Status: MARRIED     Spouse Name: N/A     Number of Children: N/A   ??? Years of Education: N/A     Occupational History   ??? Not on file.     Social History Main Topics   ??? Smoking status: Never Smoker    ??? Smokeless tobacco: Never Used   ??? Alcohol Use: No   ??? Drug Use: No   ??? Sexual Activity:     Partners: Female     Pharmacist, hospital Protection: Condom     Other Topics Concern   ??? Not on file     Social History Narrative                  ALLERGIES: Pcn      Review of Systems   Constitutional: Negative for fever and fatigue.   HENT: Positive for dental problem. Negative for rhinorrhea and sore throat.     Respiratory: Negative for cough and shortness of breath.    Cardiovascular: Negative for chest pain and palpitations.   Gastrointestinal: Negative for nausea, vomiting, abdominal pain and diarrhea.   Genitourinary: Negative for dysuria and difficulty urinating.   Musculoskeletal: Negative for myalgias and arthralgias.   Skin: Negative for color change and rash.   Neurological: Negative for light-headedness and headaches.   All other systems reviewed and are negative.      Filed Vitals:    02/06/14 2056   BP: 150/101   Pulse: 111   Temp: 98.1 ??F (36.7 ??C)   Resp: 18   Height: 6\' 1"  (1.854 m)   Weight: 92.534 kg (204 lb)   SpO2: 100%            Physical Exam   Constitutional: He appears well-developed and well-nourished. No distress.   HENT:  Head: Normocephalic and atraumatic.   Right Ear: Tympanic membrane, external ear and ear canal normal.   Left Ear: Tympanic membrane, external ear and ear canal normal.   Nose: Nose normal.   Mouth/Throat: Uvula is midline, oropharynx is clear and moist and mucous membranes are normal. Abnormal dentition. No dental abscesses or dental caries.       Eyes: Conjunctivae and EOM are normal. Pupils are equal, round, and reactive to light.   Neck: Trachea normal, normal range of motion and full passive range of motion without pain. Neck supple.   Cardiovascular: Normal rate, regular rhythm, normal heart sounds and normal pulses.    Pulmonary/Chest: Effort normal and breath sounds normal.   Abdominal: Soft. Normal appearance and bowel sounds are normal. There is no tenderness. There is no rebound and no guarding.   Musculoskeletal: Normal range of motion.   Neurological: He is alert. He has normal strength and normal reflexes.   Skin: Skin is warm and dry. He is not diaphoretic.   Psychiatric: He has a normal mood and affect. His speech is normal and behavior is normal. Judgment normal.   Nursing note and vitals reviewed.       RESULTS    Labs Reviewed - No data to display     No  results found for this or any previous visit (from the past 12 hour(s)).    No orders to display        MDM  Number of Diagnoses or Management Options     Amount and/or Complexity of Data Reviewed  Decide to obtain previous medical records or to obtain history from someone other than the patient: yes (PMP)  Review and summarize past medical records: yes      MEDICATIONS GIVEN:  Medications - No data to display     Procedures    PROGRESS NOTE:  8:58 PM   Initial assessment performed.  Recorded by Erik ObeyErin H Collins, as dictated by Romero Linerobert Matheus Spiker, PA-C.     Discharge Note:  Cheri RousCurtis Murgia's  results have been reviewed with him.  He has been counseled regarding his diagnosis, treatment, and plan.  He verbally conveys understanding and agreement of the signs, symptoms, diagnosis, treatment and prognosis and additionally agrees to follow up as discussed.  He also agrees with the care-plan and conveys that all of his questions have been answered.  I have also provided discharge instructions for him that include: educational information regarding their diagnosis and treatment, and list of reasons why they would want to return to the ED prior to their follow-up appointment, should his condition change.      CLINICAL IMPRESSION    1. Pain, dental        After Visit Plan:  D/c home    Current Discharge Medication List      START taking these medications    Details   indomethacin (INDOCIN) 25 mg capsule Take 2 Caps by mouth three (3) times daily for 10 days.  Qty: 60 Cap, Refills: 0      lidocaine (LIDOCAINE VISCOUS) 2 % solution Take 5 mL by mouth four (4) times daily as needed for Pain. Mix with equal parts water.  Swish and swallow.  Qty: 1 Bottle, Refills: 0      clindamycin (CLEOCIN) 300 mg capsule Take 1 Cap by mouth four (4) times daily for 7 days.  Qty: 28 Cap, Refills: 0              Follow-up Information  Follow up With Details Comments Contact Info    Dentist Call follow up with a dentist ASAP choose from list below     [facility]  As needed, If symptoms worsen [address]  [place] [id number]  [phone]           Recorded by Cipriano MileErin Collins, ED Scribe, as dictated by Romero Linerobert Irving Lubbers, PA-C

## 2014-02-07 MED ORDER — LIDOCAINE 2 % MUCOSAL SOLN
2 % | Freq: Four times a day (QID) | Status: DC | PRN
Start: 2014-02-07 — End: 2014-03-22

## 2014-02-07 MED ORDER — CLINDAMYCIN 300 MG CAP
300 mg | ORAL_CAPSULE | Freq: Four times a day (QID) | ORAL | Status: AC
Start: 2014-02-07 — End: 2014-02-13

## 2014-02-07 MED ORDER — INDOMETHACIN 25 MG CAP
25 mg | ORAL_CAPSULE | Freq: Three times a day (TID) | ORAL | Status: AC
Start: 2014-02-07 — End: 2014-02-16

## 2014-02-07 NOTE — Progress Notes (Signed)
Writer attempted to contact patient via phone. Someone answered phone and hung up. Writer called back and left message for patient to Regulatory affairs officercall writer at earliest convenience.

## 2014-02-08 NOTE — Progress Notes (Signed)
Writer left voicemail on patient's listed contact number to Higher education careers advisercontact writer at [employer] Incearliest convenience.
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
## 2014-02-20 ENCOUNTER — Inpatient Hospital Stay

## 2014-02-20 NOTE — Interval H&P Note (Signed)
H&P Update:  Bernette RedbirdCurtis Baby was seen and examined.  History and physical has been reviewed. There have been no significant clinical changes since the completion of the originally dated History and Physical.    Signed By: Dixon BoosJeffrey R Sofie Schendel, MD     Feb 20, 2014 8:41 AM

## 2014-03-07 MED ORDER — OXYCODONE-ACETAMINOPHEN 5 MG-325 MG TAB
5-325 mg | ORAL_TABLET | ORAL | Status: DC | PRN
Start: 2014-03-07 — End: 2014-03-22

## 2014-03-07 NOTE — Patient Instructions (Signed)
DASH Diet: After Your Visit  Your Care Instructions  The DASH diet is an eating plan that can help lower your blood pressure. DASH stands for Dietary Approaches to Stop Hypertension. Hypertension is high blood pressure.  The DASH diet focuses on eating foods that are high in calcium, potassium, and magnesium. These nutrients can lower blood pressure. The foods that are highest in these nutrients are fruits, vegetables, low-fat dairy products, nuts, seeds, and legumes. But taking calcium, potassium, and magnesium supplements instead of eating foods that are high in those nutrients does not have the same effect. The DASH diet also includes whole grains, fish, and poultry.  The DASH diet is one of several lifestyle changes your doctor may recommend to lower your high blood pressure. Your doctor may also want you to decrease the amount of sodium in your diet. Lowering sodium while following the DASH diet can lower blood pressure even further than just the DASH diet alone.  Follow-up care is a key part of your treatment and safety. Be sure to make and go to all appointments, and call your doctor if you are having problems. It's also a good idea to know your test results and keep a list of the medicines you take.  How can you care for yourself at home?  Following the DASH diet  ?? Eat 4 to 5 servings of fruit each day. A serving is 1 medium-sized piece of fruit, ?? cup chopped or canned fruit, 1/4 cup dried fruit, or 4 ounces (?? cup) of fruit juice. Choose fruit more often than fruit juice.  ?? Eat 4 to 5 servings of vegetables each day. A serving is 1 cup of lettuce or raw leafy vegetables, ?? cup of chopped or cooked vegetables, or 4 ounces (?? cup) of vegetable juice. Choose vegetables more often than vegetable juice.  ?? Get 2 to 3 servings of low-fat and fat-free dairy each day. A serving is 8 ounces of milk, 1 cup of yogurt, or 1 ?? ounces of cheese.  ?? Eat 6 to 8 servings of grains each day. A serving is 1 slice of  bread, 1 ounce of dry cereal, or ?? cup of cooked rice, pasta, or cooked cereal. Try to choose whole-grain products as much as possible.  ?? Limit lean meat, poultry, and fish to 2 servings each day. A serving is 3 ounces, about the size of a deck of cards.  ?? Eat 4 to 5 servings of nuts, seeds, and legumes (cooked dried beans, lentils, and split peas) each week. A serving is 1/3 cup of nuts, 2 tablespoons of seeds, or ?? cup cooked dried beans or peas.  ?? Limit fats and oils to 2 to 3 servings each day. A serving is 1 teaspoon of vegetable oil or 2 tablespoons of salad dressing.  ?? Limit sweets and added sugars to 5 servings or less a week. A serving is 1 tablespoon jelly or jam, ?? cup sorbet, or 1 cup of lemonade.  ?? Eat less than 2,300 milligrams (mg) of sodium a day. If you have high blood pressure, diabetes, or chronic kidney disease, if you are African-American, or if you are older than age 50, try to limit the amount of sodium you eat to less than 1,500 mg a day.  Tips for success  ?? Start small. Do not try to make dramatic changes to your diet all at once. You might feel that you are missing out on your favorite foods and then be more   likely to not follow the plan. Make small changes, and stick with them. Once those changes become habit, add a few more changes.  ?? Try some of the following:  ?? Make it a goal to eat a fruit or vegetable at every meal and at snacks. This will make it easy to get the recommended amount of fruits and vegetables each day.  ?? Try yogurt topped with fruit and nuts for a snack or healthy dessert.  ?? Add lettuce, tomato, cucumber, and onion to sandwiches.  ?? Combine a ready-made pizza crust with low-fat mozzarella cheese and lots of vegetable toppings. Try using tomatoes, squash, spinach, broccoli, carrots, cauliflower, and onions.  ?? Have a variety of cut-up vegetables with a low-fat dip as an appetizer instead of chips and dip.  ?? Sprinkle sunflower seeds or chopped almonds over  salads. Or try adding chopped walnuts or almonds to cooked vegetables.  ?? Try some vegetarian meals using beans and peas. Add garbanzo or kidney beans to salads. Make burritos and tacos with mashed pinto beans or black beans.   Where can you learn more?   Go to http://www.healthwise.net/[facility]  Enter H967 in the search box to learn more about "DASH Diet: After Your Visit."   ?? 2006-2015 Healthwise, Incorporated. Care instructions adapted under license by [employer] (which disclaims liability or warranty for this information). This care instruction is for use with your licensed healthcare professional. If you have questions about a medical condition or this instruction, always ask your healthcare professional. Healthwise, Incorporated disclaims any warranty or liability for your use of this information.  Content Version: 10.4.390249; Current as of: December 13, 2012

## 2014-03-07 NOTE — Progress Notes (Signed)
Internal Medicine Progress Note    Today's Date:  03/07/2014   Patient:  [name] [name]  Patient DOB:  [birth year]    Subjective:     Chief Complaint   Patient presents with   ??? Follow Up Chronic Condition   ??? Neck Pain   ??? Hypertension      Hypertension   This is a chronic problem. BP is not at goal/borderline. Pt takes no medication. Pt was prescribed HCTZ and picked it up. He has not started taking it.      Neck pain  This is a chronic problem. This is not controlled. +right radial arm numbness/tingling. MRI shows disk herniation at C6-C7.  Cervical disk fusion was planned in May with surgeon is Dr. Lisette Grinder.  However, tricare wasn't able to cover it. He is a pending Personal assistant.      Past Medical History   Diagnosis Date   ??? Chronic left hip pain 01/26/2013   ??? Hypertension 01/18/2014   ??? Cervical herniated disc 01/18/2014     Past Surgical History   Procedure Laterality Date   ??? Hx gi       hemorrhoidectomy      reports that he has never smoked. He has never used smokeless tobacco. He reports that he does not drink alcohol or use illicit drugs.  Family History   Problem Relation Age of Onset   ??? Arthritis-osteo Mother      hip replacement needed     Allergies   Allergen Reactions   ??? Pcn [Penicillins] Rash     Review of Systems   Positives in bold  CV:      chest pain, palpitations  PULM: SOB, wheezing, cough, sputum production    Current Outpatient Meds and Allergies     Current Outpatient Prescriptions on File Prior to Visit   Medication Sig Dispense Refill   ??? lidocaine (LIDOCAINE VISCOUS) 2 % solution Take 5 mL by mouth four (4) times daily as needed for Pain. Mix with equal parts water.  Swish and swallow.  1 Bottle  0     No current facility-administered medications on file prior to visit.     Allergies   Allergen Reactions   ??? Pcn [Penicillins] Rash     Objective:     VS:  BP 136/80   Pulse 90   Temp(Src) 98.9 ??F (37.2 ??C) (Oral)   Resp 18   Ht 6\' 1"  (1.854 m)   Wt 209 lb (94.802 kg)   BMI 27.58 kg/m2    SpO2 100%  General:   Well-nourished, well-groomed, pleasant, alert, in no acute distress  Head:  Normocephalic, atraumatic  Ears:  External ears WNL  Nose:  External nares WNL  Neuro:   Alert, conversant, appropriate, following commands  Psych:  No pressured speech, no abnormal thought content  MSK:  4/5 strength in right UE    MRI cervical spine: herniation at C6-C7    Assessment/Plan & Orders:         ICD-9-CM    1. Essential hypertension 401.9    2. Cervical herniated disc 722.0 oxyCODONE-acetaminophen (PERCOCET) 5-325 mg per tablet     Pt told to start taking HCTZ 25 mg po daily    Follow-up Disposition:  Return in about 4 weeks (around 04/04/2014) for Follow up.    *Patient verbalized understanding and agreement with the plan.  Patient was given an after-visit summary.    Fonnie Mu. Jazman Reuter, MD - Internal Medicine  03/07/2014, 1:56  PM  [facility]  [address] [place] [address]  Phone [phone]  Fax [phone]

## 2014-03-07 NOTE — Progress Notes (Signed)
[name] [name] is a 28 y.o. male presents today for follow up for BP.  He is fasting now.  PHQ does not require further attention.  Pt is in Room # 2        Learning Assessment (baseline): Completed  Depression Screening: Completed    1. Have you been to the ER, urgent care clinic since your last visit?  Hospitalized since your last visit?No    2. Have you seen or consulted any other health care providers outside of the [facility] since your last visit?  Include any pap smears or colon screening. No

## 2014-03-21 ENCOUNTER — Encounter

## 2014-03-21 NOTE — Telephone Encounter (Signed)
Patient would like refill of the above medication he has been taking 2 a day not 1 every 4 hours.

## 2014-03-22 MED ORDER — CYCLOBENZAPRINE 10 MG TAB
10 mg | ORAL_TABLET | Freq: Three times a day (TID) | ORAL | Status: AC | PRN
Start: 2014-03-22 — End: ?

## 2014-03-22 MED ORDER — CYCLOBENZAPRINE 5 MG TAB
5 mg | ORAL_TABLET | Freq: Three times a day (TID) | ORAL | Status: DC | PRN
Start: 2014-03-22 — End: 2014-03-22

## 2014-03-22 MED ORDER — LISINOPRIL 20 MG TAB
20 mg | ORAL_TABLET | Freq: Every day | ORAL | Status: AC
Start: 2014-03-22 — End: ?

## 2014-03-22 MED ORDER — OXYCODONE-ACETAMINOPHEN 5 MG-325 MG TAB
5-325 mg | ORAL_TABLET | ORAL | Status: DC | PRN
Start: 2014-03-22 — End: 2014-04-10

## 2014-03-22 NOTE — Progress Notes (Signed)
Bernette RedbirdCurtis Mccreery is a 28 y.o. male presents today for follow up for hip pain and rt shoulder pain. BP recheck.  He is fasting now.  PHQ does not require further attention.  Pt is in Room # 2      Learning Assessment (baseline): Completed  Depression Screening: Completed    1. Have you been to the ER, urgent care clinic since your last visit?  Hospitalized since your last visit?No    2. Have you seen or consulted any other health care providers outside of the [facility] since your last visit?  Include any pap smears or colon screening. No

## 2014-03-22 NOTE — Patient Instructions (Signed)
DASH Diet: After Your Visit  Your Care Instructions  The DASH diet is an eating plan that can help lower your blood pressure. DASH stands for Dietary Approaches to Stop Hypertension. Hypertension is high blood pressure.  The DASH diet focuses on eating foods that are high in calcium, potassium, and magnesium. These nutrients can lower blood pressure. The foods that are highest in these nutrients are fruits, vegetables, low-fat dairy products, nuts, seeds, and legumes. But taking calcium, potassium, and magnesium supplements instead of eating foods that are high in those nutrients does not have the same effect. The DASH diet also includes whole grains, fish, and poultry.  The DASH diet is one of several lifestyle changes your doctor may recommend to lower your high blood pressure. Your doctor may also want you to decrease the amount of sodium in your diet. Lowering sodium while following the DASH diet can lower blood pressure even further than just the DASH diet alone.  Follow-up care is a key part of your treatment and safety. Be sure to make and go to all appointments, and call your doctor if you are having problems. It's also a good idea to know your test results and keep a list of the medicines you take.  How can you care for yourself at home?  Following the DASH diet  ?? Eat 4 to 5 servings of fruit each day. A serving is 1 medium-sized piece of fruit, ?? cup chopped or canned fruit, 1/4 cup dried fruit, or 4 ounces (?? cup) of fruit juice. Choose fruit more often than fruit juice.  ?? Eat 4 to 5 servings of vegetables each day. A serving is 1 cup of lettuce or raw leafy vegetables, ?? cup of chopped or cooked vegetables, or 4 ounces (?? cup) of vegetable juice. Choose vegetables more often than vegetable juice.  ?? Get 2 to 3 servings of low-fat and fat-free dairy each day. A serving is 8 ounces of milk, 1 cup of yogurt, or 1 ?? ounces of cheese.  ?? Eat 6 to 8 servings of grains each day. A serving is 1 slice of  bread, 1 ounce of dry cereal, or ?? cup of cooked rice, pasta, or cooked cereal. Try to choose whole-grain products as much as possible.  ?? Limit lean meat, poultry, and fish to 2 servings each day. A serving is 3 ounces, about the size of a deck of cards.  ?? Eat 4 to 5 servings of nuts, seeds, and legumes (cooked dried beans, lentils, and split peas) each week. A serving is 1/3 cup of nuts, 2 tablespoons of seeds, or ?? cup of cooked beans or peas.  ?? Limit fats and oils to 2 to 3 servings each day. A serving is 1 teaspoon of vegetable oil or 2 tablespoons of salad dressing.  ?? Limit sweets and added sugars to 5 servings or less a week. A serving is 1 tablespoon jelly or jam, ?? cup sorbet, or 1 cup of lemonade.  ?? Eat less than 2,300 milligrams (mg) of sodium a day. If you have high blood pressure, diabetes, or chronic kidney disease, if you are African-American, or if you are older than age 50, try to limit the amount of sodium you eat to less than 1,500 mg a day.  Tips for success  ?? Start small. Do not try to make dramatic changes to your diet all at once. You might feel that you are missing out on your favorite foods and then be more   likely to not follow the plan. Make small changes, and stick with them. Once those changes become habit, add a few more changes.  ?? Try some of the following:  ?? Make it a goal to eat a fruit or vegetable at every meal and at snacks. This will make it easy to get the recommended amount of fruits and vegetables each day.  ?? Try yogurt topped with fruit and nuts for a snack or healthy dessert.  ?? Add lettuce, tomato, cucumber, and onion to sandwiches.  ?? Combine a ready-made pizza crust with low-fat mozzarella cheese and lots of vegetable toppings. Try using tomatoes, squash, spinach, broccoli, carrots, cauliflower, and onions.  ?? Have a variety of cut-up vegetables with a low-fat dip as an appetizer instead of chips and dip.  ?? Sprinkle sunflower seeds or chopped almonds over salads.  Or try adding chopped walnuts or almonds to cooked vegetables.  ?? Try some vegetarian meals using beans and peas. Add garbanzo or kidney beans to salads. Make burritos and tacos with mashed pinto beans or black beans.   Where can you learn more?   Go to http://www.healthwise.net/[facility]  Enter H967 in the search box to learn more about "DASH Diet: After Your Visit."   ?? 2006-2015 Healthwise, Incorporated. Care instructions adapted under license by [employer] (which disclaims liability or warranty for this information). This care instruction is for use with your licensed healthcare professional. If you have questions about a medical condition or this instruction, always ask your healthcare professional. Healthwise, Incorporated disclaims any warranty or liability for your use of this information.  Content Version: 10.5.422740; Current as of: November 23, 2013

## 2014-03-22 NOTE — Progress Notes (Signed)
Internal Medicine Progress Note    Today's Date:  03/22/2014   Patient:  [name] [name]  Patient DOB:  Jan 03, 1986    Subjective:     Chief Complaint   Patient presents with   ??? Hip Pain      Hypertension   This is a chronic problem. BP is not at goal/borderline. Pt takes HCTZ. He reports feeling nausea and abdominal pain with this medication.      Neck pain  This is a chronic problem. This is not controlled. +right radial arm numbness/tingling. MRI shows disk herniation at C6-C7.  Cervical disk fusion was planned in May with surgeon is Dr. Lisette Grinderarlson.  However, [employer] wasn't able to cover it. He [place] a [place] and [place].      Chronic left hip pain   This is a chronic problem. This is not controlled. Pain has been present since December 2013. Pain is located in the lateral hip. It is intermittent. When it comes, it's present for three- four days. Pt takes naproxen and tylenol for it. It is not effective.     Past Medical History   Diagnosis Date   ??? Chronic left hip pain 01/26/2013   ??? Hypertension 01/18/2014   ??? Cervical herniated disc 01/18/2014     Past Surgical History   Procedure Laterality Date   ??? Hx gi       hemorrhoidectomy      reports that he has never smoked. He has never used smokeless tobacco. He reports that he does not drink alcohol or use illicit drugs.  Family History   Problem Relation Age of Onset   ??? Arthritis-osteo Mother      hip replacement needed     Allergies   Allergen Reactions   ??? Pcn [Penicillins] Rash     Review of Systems   Positives in bold  CV:      chest pain, palpitations  PULM: SOB, wheezing, cough, sputum production    Current Outpatient Meds and Allergies     No current outpatient prescriptions on file prior to visit.     No current facility-administered medications on file prior to visit.     Allergies   Allergen Reactions   ??? Pcn [Penicillins] Rash     Objective:     VS:  BP 140/100 mmHg   Pulse 98   Temp(Src) 98.2 ??F (36.8 ??C) (Oral)   Resp 16   Ht 6\' 1"  (1.854 m)   Wt  209 lb (94.802 kg)   BMI 27.58 kg/m2   SpO2 97%  General:   Well-nourished, well-groomed, pleasant, alert, in no acute distress  Head:  Normocephalic, atraumatic  Ears:  External ears WNL  Nose:  External nares WNL  Neuro:   Alert, conversant, appropriate, following commands  Psych:  No pressured speech, no abnormal thought content  MSK:  Decreased cervical ROM, 5/5 UE muscle strength  Left hip:  +lateral hip TTP, +pain with internal and external rotation    Assessment/Plan & Orders:         ICD-9-CM    1. Essential hypertension 401.9 lisinopril (PRINIVIL, ZESTRIL) 20 mg tablet   2. Chronic left hip pain 719.45 REFERRAL TO ORTHOPEDIC SURGERY    338.29 oxyCODONE-acetaminophen (PERCOCET) 5-325 mg per tablet     cyclobenzaprine (FLEXERIL) 10 mg tablet   3. Cervical herniated disc 722.0 REFERRAL TO ORTHOPEDIC SURGERY     oxyCODONE-acetaminophen (PERCOCET) 5-325 mg per tablet     cyclobenzaprine (FLEXERIL) 10 mg tablet  Pt told to start taking HCTZ 25 mg po daily  May benefit from steroid injection to left hip    Follow-up Disposition:  Return in about 3 months (around 06/22/2014) for Follow up.    *Patient verbalized understanding and agreement with the plan.  Patient was given an after-visit summary.    Fonnie MuWendy F. Genola Yuille, MD - Internal Medicine  03/22/2014, 1:56 PM  [facility]  [address]  [place], [place] [id number]  Phone [phone]  Fax [phone]

## 2014-04-10 ENCOUNTER — Encounter

## 2014-04-11 MED ORDER — OXYCODONE-ACETAMINOPHEN 5 MG-325 MG TAB
5-325 mg | ORAL_TABLET | ORAL | Status: DC | PRN
Start: 2014-04-11 — End: 2014-06-05

## 2014-04-11 NOTE — Telephone Encounter (Signed)
Patient called to f/u on refill request. I informed caller that provider had to leave office in emergency and I will resend request. Patient also updated phone number.

## 2014-04-16 NOTE — Telephone Encounter (Signed)
Patient has given verbal authorization for Doralee Albino, to pick up his written medication that is ready for pick up at front desk. Patient states he had a family emergency in [place] and was unable to pick up and Ebony Cargo will be meeting him in [place]. I informed patient that because this is a controlled substance and patient is not listed on authorization list, we are unable to release medication. Patient states he will call us back

## 2014-06-05 ENCOUNTER — Encounter

## 2014-06-07 NOTE — Telephone Encounter (Signed)
Patient called to follow up on prescription refill

## 2014-06-11 MED ORDER — OXYCODONE-ACETAMINOPHEN 5 MG-325 MG TAB
5-325 mg | ORAL_TABLET | ORAL | Status: DC | PRN
Start: 2014-06-11 — End: 2014-07-29

## 2014-07-16 ENCOUNTER — Encounter

## 2014-07-16 NOTE — Telephone Encounter (Signed)
i informed patient that he may need an appointment. He stated ok

## 2014-07-29 ENCOUNTER — Ambulatory Visit: Admit: 2014-07-29 | Discharge: 2014-07-29 | Attending: Nurse Practitioner | Primary: Internal Medicine

## 2014-07-29 ENCOUNTER — Encounter: Attending: Internal Medicine | Primary: Internal Medicine

## 2014-07-29 DIAGNOSIS — M5020 Other cervical disc displacement, unspecified cervical region: Secondary | ICD-10-CM

## 2014-07-29 MED ORDER — OXYCODONE-ACETAMINOPHEN 5 MG-325 MG TAB
5-325 mg | ORAL_TABLET | Freq: Every evening | ORAL | Status: AC
Start: 2014-07-29 — End: ?
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
## 2014-07-29 NOTE — Patient Instructions (Signed)
Herniated Disc: After Your Visit  Your Care Instructions     The bones that form the spine in your back are cushioned by small discs. If a disc is damaged, it may bulge or break open (herniate). A herniated disc can result from normal wear and tear as we age or from an injury or disease. If a herniated disc presses on a nerve, it can cause pain and numbness in your leg (sciatica) and/or back pain.  You may be able to heal your herniated disc with a few weeks or months of rest, medicine, and exercises. In some cases, you may need surgery.  Follow-up care is a key part of your treatment and safety. Be sure to make and go to all appointments, and call your doctor if you are having problems. It's also a good idea to know your test results and keep a list of the medicines you take.  How can you care for yourself at home?  ?? Take your medicines exactly as prescribed. Call your doctor if you think you are having a problem with your medicine.  ?? Ask your doctor if you can take an over-the-counter pain medicine, such as acetaminophen (Tylenol), ibuprofen (Advil, Motrin), or naproxen (Aleve). Read and follow all instructions on the label.  ?? Do not take two or more pain medicines at the same time unless the doctor told you to. Many pain medicines have acetaminophen, which is Tylenol. Too much acetaminophen (Tylenol) can be harmful.  ?? Rest your back if your pain is severe.  ?? Avoid movements and positions that increase your pain or numbness.  ?? Try taking short walks and doing light activities that do not cause pain. Even if you are feeling some pain, it is important to keep your muscles active and strong.  ?? Use heat or ice to relieve pain.  ?? To apply heat, put a warm water bottle, heating pad set on low, or warm cloth on your back. Do not go to sleep with a heating pad on your skin.  ?? To use ice, put ice or a cold pack on the area for 10 to 20 minutes at a time. Put a thin cloth between the ice and your skin.   ?? Your doctor may recommend a physical therapy program, where you learn exercises to do at home. These exercises strengthen the muscles that support your lower back and prevent reinjury.  ?? Stay at a healthy weight. This may reduce the load on your back.  ?? Quit smoking if you smoke. If you need help quitting, talk to your doctor about stop-smoking programs and medicines. These can increase your chances of quitting for good.  ?? To avoid hurting your back when lifting:  ?? Lift with your legs, not your back, by squatting and bending your knees. Avoid bending forward at the waist when lifting.  ?? Rise slowly.  ?? Keep the load as close to your body as possible, at the level of your navel.  ?? Avoid turning or twisting your body while holding a heavy object.  ?? Get help if you need to lift a heavy object. Never lift a heavy object above shoulder level.  When should you call for help?  Call 911 anytime you think you may need emergency care. For example, call if:  ?? You suddenly cannot walk or stand.  ?? You have sudden numbness or weakness in both legs.  Call your doctor now or seek immediate medical care if:  ?? You   have a new loss of bowel or bladder control.  ?? You have new pain, numbness, tingling, or weakness.  ?? Your pain is getting worse.  Watch closely for changes in your health, and be sure to contact your doctor if:  ?? You do not get better as expected.   Where can you learn more?   Go to http://www.healthwise.net/[facility]  Enter F534 in the search box to learn more about "Herniated Disc: After Your Visit."   ?? 2006-2015 Healthwise, Inc[employer]. Care instructions adapted under license by [employer] (which disclaims liability or warranty for this information). This care instruction is for use with your licensed healthcare professional. If you have questions about a medical condition or this instruction, always ask your healthcare professional. Healthwise,  Incorporated disclaims any warranty or liability for your use of this information.  Content Version: 10.5.422740; Current as of: November 15, 2013

## 2014-09-02 ENCOUNTER — Encounter: Attending: Nurse Practitioner | Primary: Internal Medicine

## 2014-09-09 ENCOUNTER — Encounter

## 2015-01-19 ENCOUNTER — Emergency Department
Admission: EM | Admit: 2015-01-19 | Discharge: 2015-01-19 | Disposition: A | Discharge status: Home / Self Care | Payer: Self-pay | Attending: Emergency Medicine | Admitting: Emergency Medicine

## 2015-01-19 ENCOUNTER — Emergency Department: Payer: Self-pay

## 2015-01-19 DIAGNOSIS — K0889 Other specified disorders of teeth and supporting structures: Secondary | ICD-10-CM

## 2015-01-19 DIAGNOSIS — K088 Other specified disorders of teeth and supporting structures: Secondary | ICD-10-CM | POA: Insufficient documentation

## 2015-01-19 MED ORDER — CLINDAMYCIN HCL 300 MG PO CAPS
300.0000 mg | ORAL_CAPSULE | Freq: Four times a day (QID) | ORAL | Status: AC
Start: 2015-01-19 — End: 2015-01-26

## 2015-01-19 MED ORDER — OXYCODONE-ACETAMINOPHEN 5-325 MG PO TABS
1.0000 | ORAL_TABLET | Freq: Three times a day (TID) | ORAL | Status: DC | PRN
Start: 2015-01-19 — End: 2018-01-19

## 2015-01-19 MED ORDER — OXYCODONE-ACETAMINOPHEN 5-325 MG PO TABS
1.0000 | ORAL_TABLET | Freq: Once | ORAL | Status: AC
Start: 2015-01-19 — End: 2015-01-19
  Administered 2015-01-19: 1 via ORAL
  Filled 2015-01-19: qty 1

## 2015-01-19 NOTE — ED Notes (Signed)
Pt c/o filling fell out of lower right tooth Saturday morning. Pain with chewing and talking. Last tylenol at noon. Motrin at 1430. Pt does not have dentist

## 2015-01-19 NOTE — ED Provider Notes (Signed)
EMERGENCY DEPARTMENT NOTE    Physician/Midlevel provider first contact with patient: 01/19/15 1511         HISTORY OF PRESENT ILLNESS   Historian:Patient  Translator Used: No    Chief Complaint: Dental Pain     Mechanism of Injury:       29 y.o. male     1. Location of symptoms: RLQ 2nd molar  2. Onset of symptoms: 2 days ago  3. What was patient doing when symptoms started (Context): RLQ 2nd molar feeling came out while chewing chicken, now with increasing pain unable to chew on affected side, no fevers, no dental care  4. Severity: moderate  5. Timing: itnermittent  6. Activities that worsen symptoms: eating  7. Activities that improve symptoms: nothign  8. Quality: aching  9. Radiation of symptoms: no  10. Associated signs and Symptoms: see above  11. Are symptoms worsening? yes  MEDICAL HISTORY     Past Medical History:  History reviewed. No pertinent past medical history.    Past Surgical History:  History reviewed. No pertinent past surgical history.    Social History:  History     Social History   . Marital Status: N/A     Spouse Name: N/A   . Number of Children: N/A   . Years of Education: N/A     Occupational History   . Not on file.     Social History Main Topics   . Smoking status: Not on file   . Smokeless tobacco: Not on file   . Alcohol Use: Not on file   . Drug Use: Not on file   . Sexual Activity: Not on file     Other Topics Concern   . Not on file     Social History Narrative   . No narrative on file       Family History:  No family history on file.    Outpatient Medication:  Discharge Medication List as of 01/19/2015  3:25 PM            REVIEW OF SYSTEMS   Review of Systems   Constitutional: Negative for fever and chills.   HENT: Negative for congestion and sore throat.    Respiratory: Negative for cough, shortness of breath and wheezing.    Gastrointestinal: Negative for nausea, vomiting and abdominal pain.   Genitourinary: Negative for dysuria.   Musculoskeletal: Negative for myalgias, joint pain  and falls.   Skin: Negative for rash.   Neurological: Negative for headaches.   All other systems reviewed and are negative.       PHYSICAL EXAM   ED Triage Vitals   Enc Vitals Group      BP 01/19/15 1514 139/92 mmHg      Heart Rate 01/19/15 1514 90      Resp Rate 01/19/15 1514 16      Temp 01/19/15 1514 98.1 F (36.7 C)      Temp src --       SpO2 01/19/15 1514 100 %      Weight 01/19/15 1514 90.719 kg      Height 01/19/15 1514 1.854 m      Head Cir --       Peak Flow --       Pain Score 01/19/15 1514 8      Pain Loc --       Pain Edu? --       Excl. in [facility]? --      Nursing  note and vitals reviewed.  Constitutional:  Well developed, well nourished. Awake & Oriented x3.Marland Kitchen  ENT:  Mucous membranes are moist and intact. Oropharynx is clear and symmetric.  Patent airway. Mouth with MMM no lesions noted, RLQ 2nd molar with extensive caries, missing filling on anterior aspect, no drainage, tenderness on percussion of the tooth.  Neck:  Supple. Full ROM.    Cardiovascular:  Regular rate. Regular rhythm. No murmurs, rubs, or gallops.  Pulmonary/Chest:  No evidence of respiratory distress. Clear to auscultation bilaterally.  No wheezing, rales or rhonchi.   Abdominal:  Soft and non-distended. There is no tenderness. No rebound, guarding, or rigidity.  Extremities:  No edema. No cyanosis. No clubbing. Full range of motion in all extremities.  Psychiatric:  Good eye contact. Normal interaction, affect, and behavior.        MEDICAL DECISION MAKING   29 y.o. Male with no pert PMHx presents to ED with dental pain after filling removal by accident while chewing  NO fevers  Will cover with Abx pain adressed, follow up with dentist recommended  DISCUSSION      Vital Signs: Reviewed the patient?s vital signs.   Nursing Notes: Reviewed and utilized available nursing notes.  Medical Records Reviewed: Reviewed available past medical records.  Counseling: The emergency provider has spoken with the patient and discussed today?s findings,  in addition to providing specific details for the plan of care.  Questions are answered and there is agreement with the plan.              PULSE OXIMETRY    Oxygen Saturation by Pulse Oximetry: 100%  Interventions: none  Interpretation:  normal    EMERGENCY DEPT. MEDICATIONS      ED Medication Orders     Start Ordered     Status Ordering Provider    01/19/15 1523 01/19/15 1522  oxyCODONE-acetaminophen (PERCOCET) 5-325 MG per tablet 1 tablet   Once     Route: Oral  Ordered Dose: 1 tablet     Last MAR action:  Given Maylee Bare PAVLOVNA          LABORATORY RESULTS    Ordered and independently interpreted AVAILABLE laboratory tests. Please see results section in chart for full details.  No results found for this or any previous visit.        DIAGNOSIS      Diagnosis:  Final diagnoses:   Pain, dental       Disposition:  ED Disposition     Discharge Celso Amy discharge to home/self care.    Condition at disposition: Stable            Prescriptions:  Discharge Medication List as of 01/19/2015  3:25 PM      START taking these medications    Details   clindamycin (CLEOCIN) 300 MG capsule Take 1 capsule (300 mg total) by mouth 4 (four) times daily., Starting 01/19/2015, Until Sun 01/26/15, Print      oxyCODONE-acetaminophen (PERCOCET) 5-325 MG per tablet Take 1 tablet by mouth every 8 (eight) hours as needed for Pain., Starting 01/19/2015, Until Discontinued, Print                 Durand Wittmeyer, Samara Deist, NP  01/19/15 1531

## 2015-01-19 NOTE — Discharge Instructions (Signed)
Toothache     You have been seen for a toothache.     A toothache happens when the nerve of the tooth gets irritated. Infection, trauma, decay or cavities may cause this irritation. There may be pain after a tooth is lost (from trauma or being pulled).     Symptoms may include:  · Pain with chewing.  · Sensitivity to hot and cold.  · The gums may get beefy red or inflamed in color.     Treatment depends on the toothache's cause. Follow up with a dentist right away for cavities and chipped teeth. Also see a dentist right away for post-extraction (after pulling) pain. Non-steroidal anti-inflammatory medicines (ibuprofen (Advil® or Motrin®); naproxen (Aleve®, Naprosyn®), etc.) can usually treat dental pain. Often, simple toothaches do not need narcotic pain medicines.     Emergency and Urgent Care Doctors are not Dentists. Urgent Care and Emergency Department treatment IS NO SUBSTITUTE for treatment by a licensed dentist. Follow up with a dentist right away and plan to see your dentist on a regular schedule.     YOU SHOULD SEEK MEDICAL ATTENTION IMMEDIATELY, EITHER HERE OR AT THE NEAREST EMERGENCY DEPARTMENT, IF ANY OF THE FOLLOWING OCCURS:  · Swelling of the face, neck, and cheeks.  · High fever (temperature higher than 100.4ºF / 38ºC), chills, vomiting, signs of dehydration.  · You can’t swallow your saliva (spit) or medicine.

## 2019-03-05 ENCOUNTER — Encounter: Attending: Internal Medicine | Primary: Internal Medicine

## 2021-11-10 ENCOUNTER — Emergency Department (HOSPITAL_COMMUNITY)
Admission: EM | Admit: 2021-11-10 | Discharge: 2021-11-10 | Disposition: A | Discharge status: Home / Self Care | Payer: No Typology Code available for payment source | Attending: Emergency Medicine | Admitting: Emergency Medicine

## 2021-11-10 ENCOUNTER — Encounter (HOSPITAL_COMMUNITY): Payer: Self-pay | Admitting: Emergency Medicine

## 2021-11-10 DIAGNOSIS — R030 Elevated blood-pressure reading, without diagnosis of hypertension: Secondary | ICD-10-CM | POA: Diagnosis not present

## 2021-11-10 DIAGNOSIS — Y9259 Other trade areas as the place of occurrence of the external cause: Secondary | ICD-10-CM | POA: Diagnosis not present

## 2021-11-10 DIAGNOSIS — F1410 Cocaine abuse, uncomplicated: Secondary | ICD-10-CM | POA: Insufficient documentation

## 2021-11-10 DIAGNOSIS — Z20822 Contact with and (suspected) exposure to covid-19: Secondary | ICD-10-CM | POA: Insufficient documentation

## 2021-11-10 DIAGNOSIS — M545 Low back pain, unspecified: Secondary | ICD-10-CM | POA: Diagnosis not present

## 2021-11-10 DIAGNOSIS — F1994 Other psychoactive substance use, unspecified with psychoactive substance-induced mood disorder: Secondary | ICD-10-CM

## 2021-11-10 DIAGNOSIS — W182XXA Fall in (into) shower or empty bathtub, initial encounter: Secondary | ICD-10-CM | POA: Diagnosis not present

## 2021-11-10 DIAGNOSIS — S39012A Strain of muscle, fascia and tendon of lower back, initial encounter: Secondary | ICD-10-CM

## 2021-11-10 DIAGNOSIS — W010XXA Fall on same level from slipping, tripping and stumbling without subsequent striking against object, initial encounter: Secondary | ICD-10-CM

## 2021-11-10 LAB — CBC WITH DIFFERENTIAL/PLATELET
Abs Immature Granulocytes: 0 10*3/uL (ref 0.00–0.07)
Basophils Absolute: 0 10*3/uL (ref 0.0–0.1)
Basophils Relative: 1 %
Eosinophils Absolute: 0 10*3/uL (ref 0.0–0.5)
Eosinophils Relative: 1 %
HCT: 43.7 % (ref 39.0–52.0)
Hemoglobin: 15.3 g/dL (ref 13.0–17.0)
Immature Granulocytes: 0 %
Lymphocytes Relative: 28 %
Lymphs Abs: 1.3 10*3/uL (ref 0.7–4.0)
MCH: 31 pg (ref 26.0–34.0)
MCHC: 35 g/dL (ref 30.0–36.0)
MCV: 88.6 fL (ref 80.0–100.0)
Monocytes Absolute: 0.4 10*3/uL (ref 0.1–1.0)
Monocytes Relative: 9 %
Neutro Abs: 2.7 10*3/uL (ref 1.7–7.7)
Neutrophils Relative %: 61 %
Platelets: 275 10*3/uL (ref 150–400)
RBC: 4.93 MIL/uL (ref 4.22–5.81)
RDW: 12.6 % (ref 11.5–15.5)
WBC: 4.4 10*3/uL (ref 4.0–10.5)
nRBC: 0 % (ref 0.0–0.2)

## 2021-11-10 LAB — COMPREHENSIVE METABOLIC PANEL
ALT: 16 U/L (ref 0–44)
AST: 25 U/L (ref 15–41)
Albumin: 4.8 g/dL (ref 3.5–5.0)
Alkaline Phosphatase: 56 U/L (ref 38–126)
Anion gap: 10 (ref 5–15)
BUN: 15 mg/dL (ref 6–20)
CO2: 25 mmol/L (ref 22–32)
Calcium: 9.4 mg/dL (ref 8.9–10.3)
Chloride: 100 mmol/L (ref 98–111)
Creatinine, Ser: 1.31 mg/dL — ABNORMAL HIGH (ref 0.61–1.24)
GFR, Estimated: >60 mL/min (ref >60)
Glucose, Bld: 103 mg/dL — ABNORMAL HIGH (ref 70–99)
Potassium: 3.7 mmol/L (ref 3.5–5.1)
Sodium: 135 mmol/L (ref 135–145)
Total Bilirubin: 0.7 mg/dL (ref 0.3–1.2)
Total Protein: 8.6 g/dL — ABNORMAL HIGH (ref 6.5–8.1)

## 2021-11-10 LAB — ETHANOL: Alcohol, Ethyl (B): <10 mg/dL (ref <10)

## 2021-11-10 LAB — RESP PANEL BY RT-PCR (FLU A&B, COVID) ARPGX2
Influenza A by PCR: NEGATIVE
Influenza B by PCR: NEGATIVE
SARS Coronavirus 2 by RT PCR: NEGATIVE

## 2021-11-10 LAB — RAPID URINE DRUG SCREEN, HOSP PERFORMED
Amphetamines: POSITIVE — AB
Barbiturates: NOT DETECTED
Benzodiazepines: NOT DETECTED
Cocaine: POSITIVE — AB
Opiates: NOT DETECTED
Tetrahydrocannabinol: NOT DETECTED

## 2021-11-10 MED ORDER — LORAZEPAM 1 MG PO TABS
1.0000 mg | ORAL_TABLET | Freq: Once | ORAL | Status: AC
Start: 2021-11-10 — End: 2021-11-10
  Administered 2021-11-10: 1 mg via ORAL
  Filled 2021-11-10: qty 1

## 2021-11-10 MED ORDER — ACETAMINOPHEN 500 MG PO TABS
1000.0000 mg | ORAL_TABLET | Freq: Once | ORAL | Status: AC
  Administered 2021-11-10: 1000 mg via ORAL
  Filled 2021-11-10: qty 2

## 2021-11-10 NOTE — ED Notes (Signed)
I provided reinforced discharge education based off of discharge instructions. Pt acknowledged and understood my education. Pt had no further questions/concerns for provider/myself.  °
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
## 2021-11-10 NOTE — ED Notes (Signed)
Pt refused discharge vital signs

## 2021-11-10 NOTE — ED Notes (Signed)
Pt currently lethargic and sleepy. Resting with equal chest rise and fall, unlabored breathing.
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
## 2021-11-10 NOTE — ED Notes (Signed)
Provided pt with bottle of water and x2 [place] sandwiches.

## 2021-11-10 NOTE — ED Triage Notes (Signed)
Per EMS-tripped in the shower, complaining of back pain-small laceration to left index finger-patient got dressed and met EMS [place] front of [place]

## 2021-11-10 NOTE — Discharge Instructions (Addendum)
It was our pleasure to provide your ER care today - we hope that you feel better.  Avoid cocaine/drug use, as use can be very harmful to your physical health and mental well being.  See resource guide provided to help you access treatment/support programs, as well as additional behavioral health resources.   Also follow up closely with primary care doctor in the coming week - have your blood pressure rechecked then, as it is high today.   For mental health issues and/or crisis, you may go directly to [facility] - it is open 24/7, and walk-ins are welcome.   Return to ER if worse, new symptoms, fevers, chest pain, trouble breathing, severe/intractable pain, or other concern.   You were given medication in the ER - no driving for the next 6 hours.

## 2021-11-10 NOTE — ED Notes (Addendum)
Pt was informed of [place] regarding dressing out into hospital attire, all belongings to be placed into belonging bags and labeled with pt ID and placed into triage pt belonging bin. Pt acknowledged these instructions I provided. Pt was wanded by security prior to transfer into emergency department. Pt had no further questions or concerns at this time.

## 2022-01-05 ENCOUNTER — Emergency Department
Admission: EM | Admit: 2022-01-05 | Discharge: 2022-01-05 | Disposition: A | Discharge status: Home / Self Care | Payer: No Typology Code available for payment source | Attending: Emergency Medicine | Admitting: Emergency Medicine

## 2022-01-05 ENCOUNTER — Emergency Department: Payer: No Typology Code available for payment source

## 2022-01-05 ENCOUNTER — Other Ambulatory Visit: Payer: Self-pay

## 2022-01-05 DIAGNOSIS — R200 Anesthesia of skin: Secondary | ICD-10-CM | POA: Insufficient documentation

## 2022-01-05 DIAGNOSIS — R202 Paresthesia of skin: Secondary | ICD-10-CM | POA: Insufficient documentation

## 2022-01-05 DIAGNOSIS — M542 Cervicalgia: Secondary | ICD-10-CM | POA: Diagnosis present

## 2022-01-05 DIAGNOSIS — R531 Weakness: Secondary | ICD-10-CM | POA: Insufficient documentation

## 2022-01-05 LAB — URINALYSIS, ROUTINE W REFLEX MICROSCOPIC
Bilirubin Urine: NEGATIVE
Glucose, UA: NEGATIVE mg/dL
Hgb urine dipstick: NEGATIVE
Ketones, ur: NEGATIVE mg/dL
Leukocytes,Ua: NEGATIVE
Nitrite: NEGATIVE
Protein, ur: NEGATIVE mg/dL
Specific Gravity, Urine: 1.015 (ref 1.005–1.030)
pH: 6 (ref 5.0–8.0)

## 2022-01-05 LAB — BASIC METABOLIC PANEL
Anion gap: 6 (ref 5–15)
BUN: 13 mg/dL (ref 6–20)
CO2: 23 mmol/L (ref 22–32)
Calcium: 9.3 mg/dL (ref 8.9–10.3)
Chloride: 109 mmol/L (ref 98–111)
Creatinine, Ser: 1.17 mg/dL (ref 0.61–1.24)
GFR, Estimated: >60 mL/min (ref >60)
Glucose, Bld: 92 mg/dL (ref 70–99)
Potassium: 3.9 mmol/L (ref 3.5–5.1)
Sodium: 138 mmol/L (ref 135–145)

## 2022-01-05 LAB — CBC
HCT: 44.1 % (ref 39.0–52.0)
Hemoglobin: 15.1 g/dL (ref 13.0–17.0)
MCH: 30.4 pg (ref 26.0–34.0)
MCHC: 34.2 g/dL (ref 30.0–36.0)
MCV: 88.9 fL (ref 80.0–100.0)
Platelets: 218 10*3/uL (ref 150–400)
RBC: 4.96 MIL/uL (ref 4.22–5.81)
RDW: 13.4 % (ref 11.5–15.5)
WBC: 3.7 10*3/uL — ABNORMAL LOW (ref 4.0–10.5)
nRBC: 0 % (ref 0.0–0.2)

## 2022-01-05 IMAGING — MR MR HEAD W/O CM
11 series · 48 of 48 positions shown · non-contrast
Comparison: None.

CLINICAL DATA: Acute neuro deficit. Rule out stroke. Right-sided
weakness.

EXAM:
MRI HEAD WITHOUT CONTRAST
TECHNIQUE: Multiplanar, multiecho pulse sequences of the brain and surrounding
structures were obtained without intravenous contrast.

[Series 5: ax dwi_tracew · axial · 3.0mm · 0.65mm/px · z∈[-39,+116]mm · 4 of 48 slices shown]
[im 1/48]
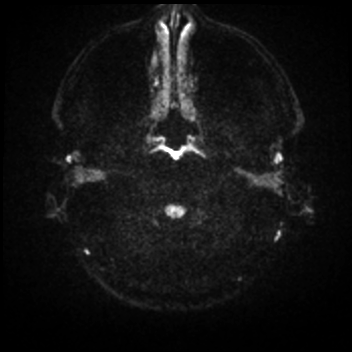
[im 16/48]
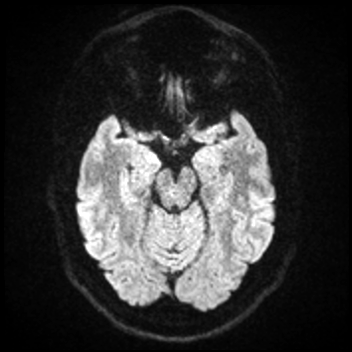
[im 32/48]
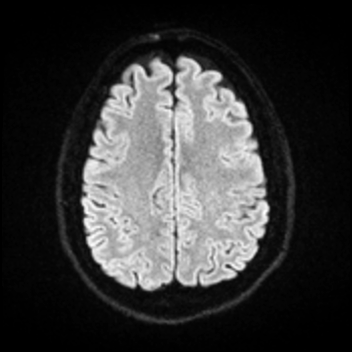
[im 48/48]
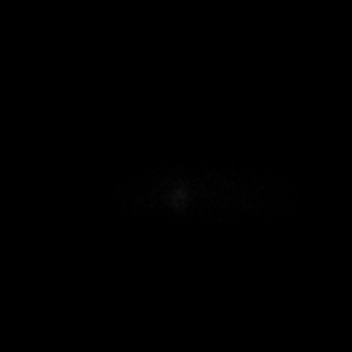

[Series 6: ax dwi_adc · axial · 3.0mm · 0.65mm/px · z∈[-39,+112]mm · 4 of 47 slices shown]
[im 1/47]
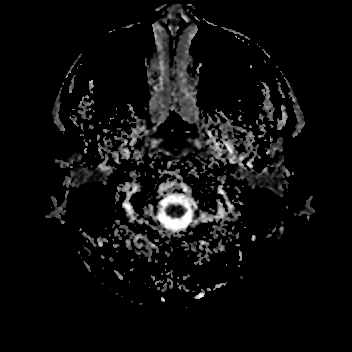
[im 16/47]
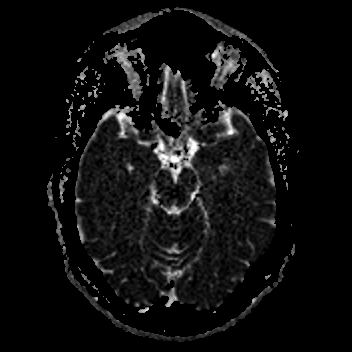
[im 31/47]
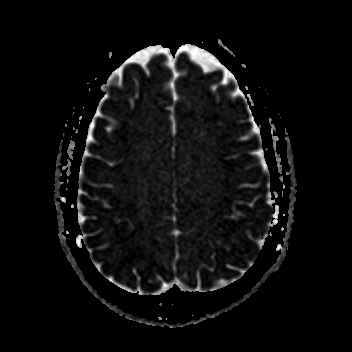
[im 47/47]
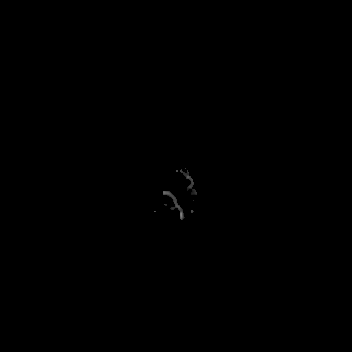

[Series 7: cor dwi_tracew · coronal · 5.0mm · 0.65mm/px · 3 of 40 slices shown]
[im 1/40]
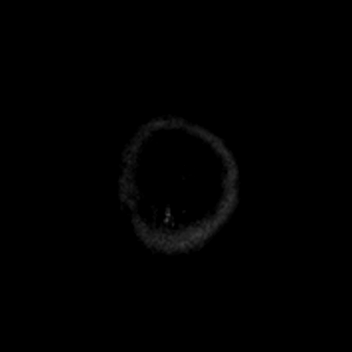
[im 20/40]
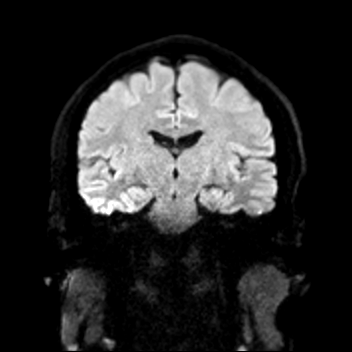
[im 40/40]
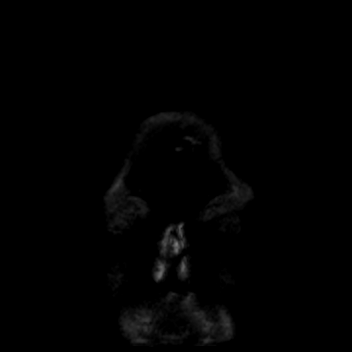

[Series 8: cor dwi_adc · coronal · 5.0mm · 0.65mm/px · 3 of 40 slices shown]
[im 1/40]
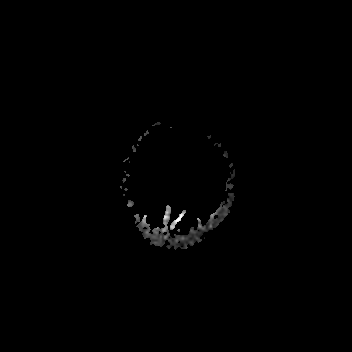
[im 20/40]
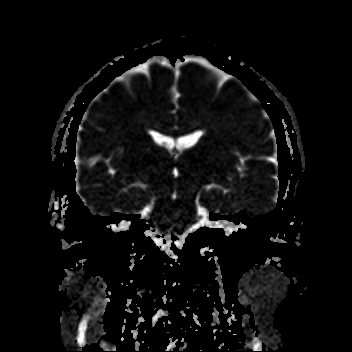
[im 40/40]
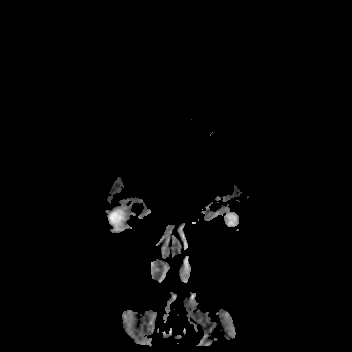

[Series 9: T1 · sagittal · 5.0mm · 0.62mm/px · 2 of 25 slices shown (1 of 2)]
[im 1/25]
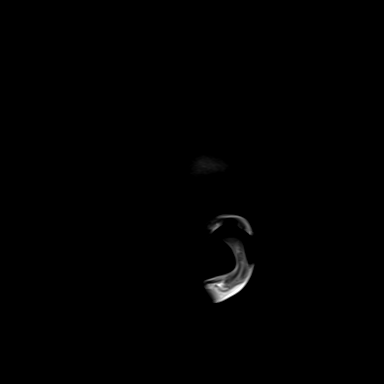
[im 25/25]
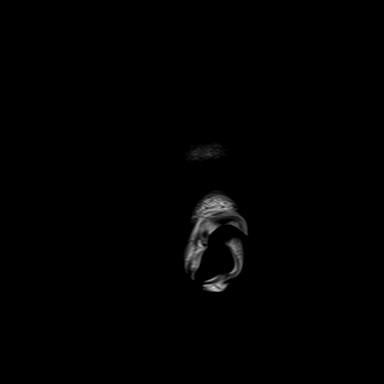

[Series 10: T2 · axial · 5.0mm · 0.53mm/px · z∈[-34,+110]mm · 2 of 25 slices shown (1 of 2)]
[im 1/25]
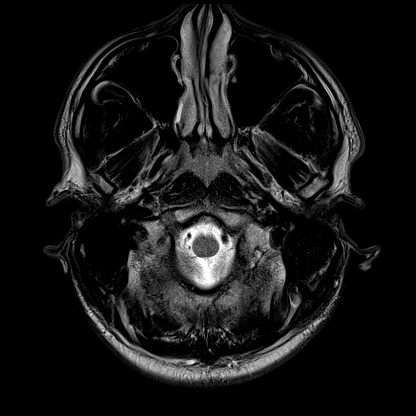
[im 25/25]
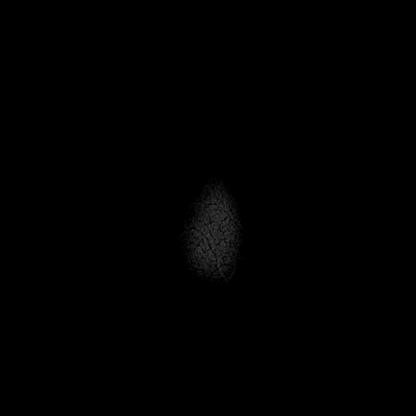

[Series 12: pha_images · axial · 3.0mm · 0.90mm/px · z∈[-51,+126]mm · 5 of 60 slices shown]
[im 1/60]
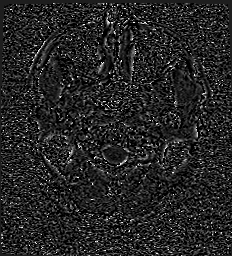
[im 15/60]
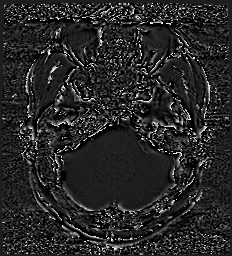
[im 30/60]
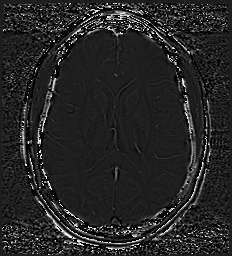
[im 45/60]
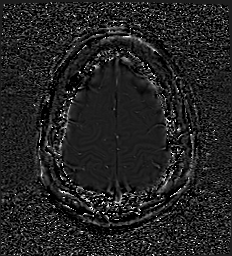
[im 60/60]
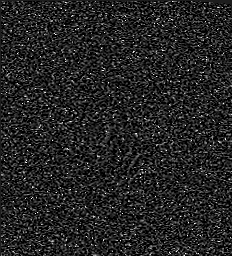

[Series 13: swi_images · axial · 3.0mm · 0.90mm/px · z∈[-51,+126]mm · 5 of 60 slices shown]
[im 1/60]
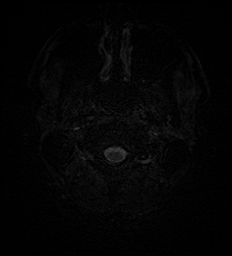
[im 15/60]
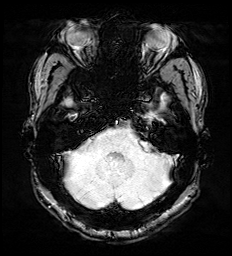
[im 30/60]
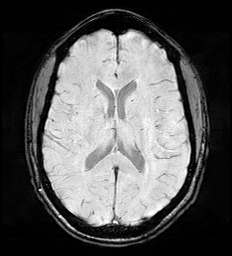
[im 45/60]
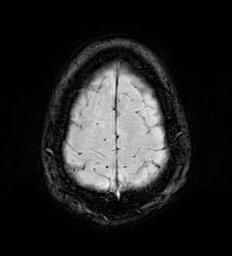
[im 60/60]
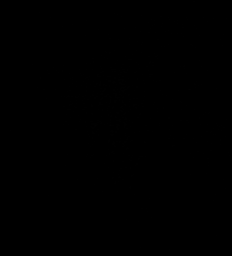

[Series 15: FLAIR · axial · 3.0mm · 0.53mm/px · z∈[-43,+119]mm · 4 of 55 slices shown]
[im 1/55]
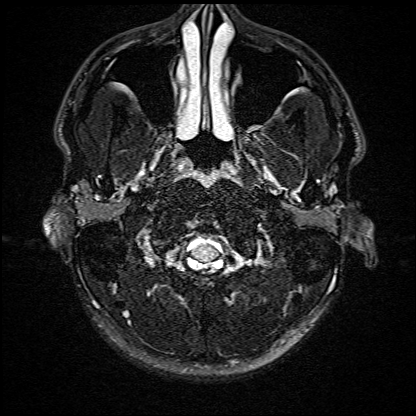
[im 19/55]
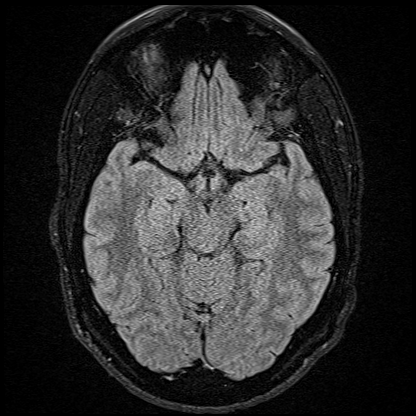
[im 37/55]
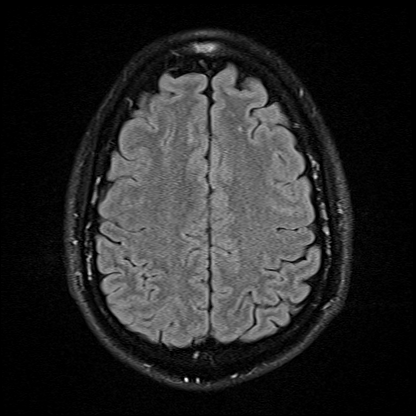
[im 55/55]
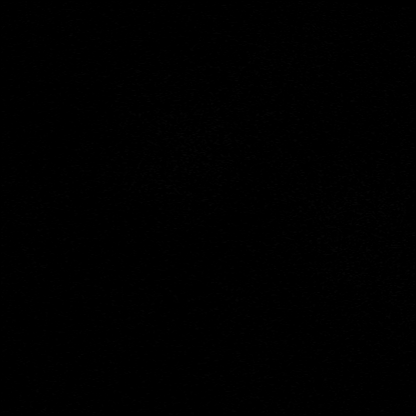

[Series 16: T1 · axial · 1.0mm · 0.98mm/px · z∈[-49,+125]mm · 14 of 173 slices shown (2 of 2)]
[im 1/173]
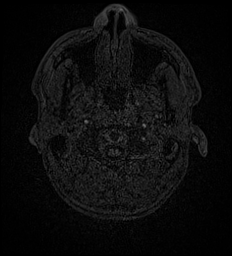
[im 14/173]
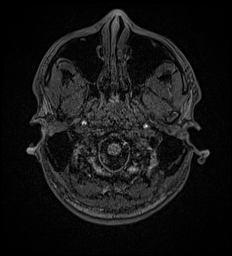
[im 27/173]
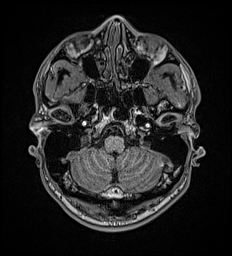
[im 40/173]
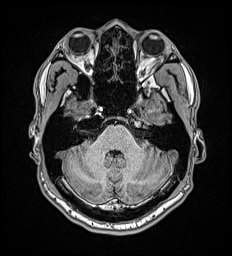
[im 53/173]
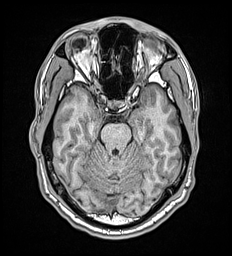
[im 67/173]
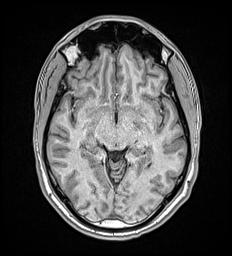
[im 80/173]
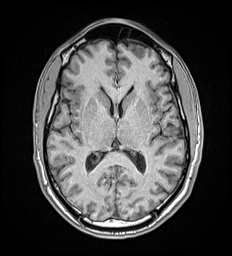
[im 93/173]
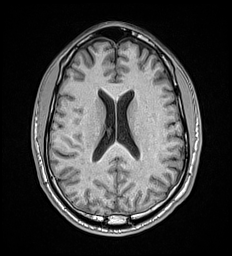
[im 106/173]
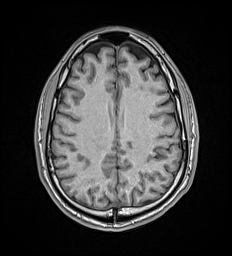
[im 120/173]
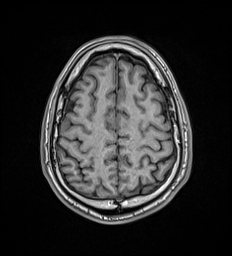
[im 133/173]
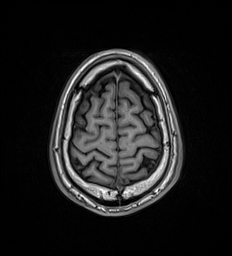
[im 146/173]
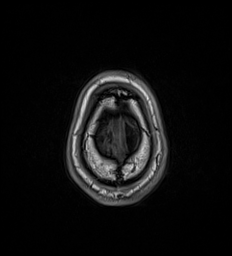
[im 159/173]
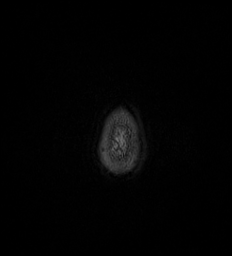
[im 173/173]
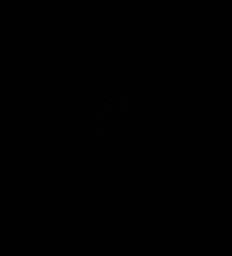

[Series 17: T2 · coronal · 5.0mm · 0.73mm/px · 2 of 29 slices shown (2 of 2)]
[im 1/29]
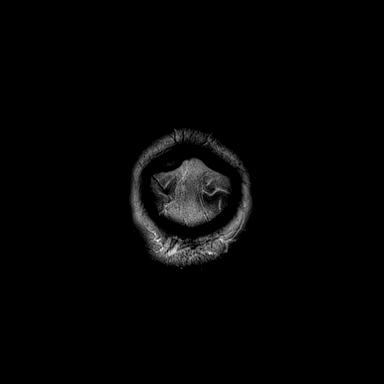
[im 29/29]
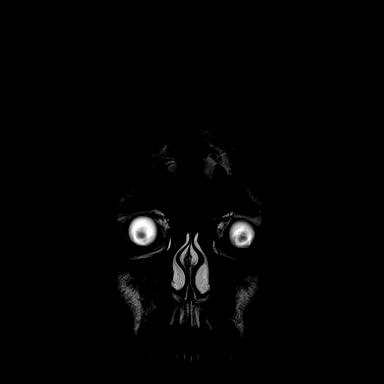

[48 of 48 positions shown; findings below may reference images not displayed]

FINDINGS: Brain: No acute infarction, hemorrhage, hydrocephalus, extra-axial
collection or mass lesion. Multiple small deep white matter
hyperintensities in the frontal parietal lobes bilaterally.
Brainstem intact

Vascular: Normal arterial flow voids

Skull and upper cervical spine: No focal skeletal lesion

Sinuses/Orbits: Mild mucosal edema paranasal sinuses. Negative orbit

Other: None
IMPRESSION: No acute abnormality

Multiple small white matter hyperintensities bilaterally,
nonspecific. These may be due to chronic microvascular ischemic
change, correlate with risk factors. Pattern not typical for
demyelinating disease.

## 2022-01-05 IMAGING — MR MR CERVICAL SPINE W/O CM
5 series · 40 of 48 positions shown · non-contrast
Comparison: Cervical spine radiographs [DATE]

CLINICAL DATA: Acute neck pain.  History of cervical fusion.

EXAM:
MRI CERVICAL SPINE WITHOUT CONTRAST
TECHNIQUE: Multiplanar, multisequence MR imaging of the cervical spine was
performed. No intravenous contrast was administered.

[Series 13: T2 · sagittal · 3.0mm · 0.62mm/px · 6 of 15 slices shown (1 of 2)]
[im 1/15]
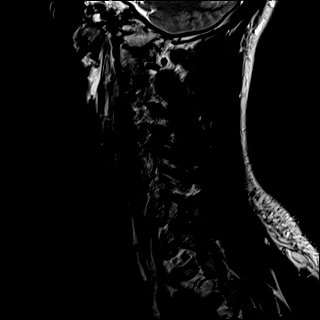
[im 3/15]
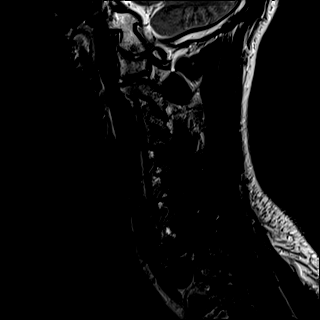
[im 6/15]
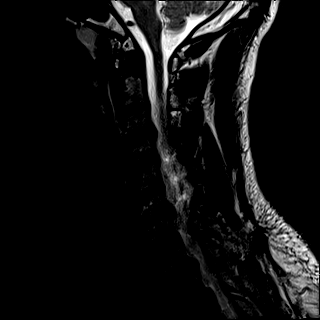
[im 9/15]
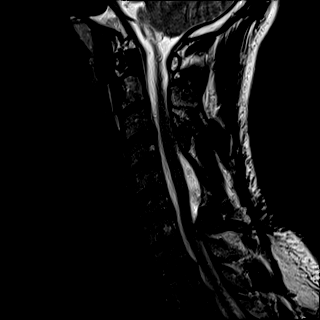
[im 12/15]
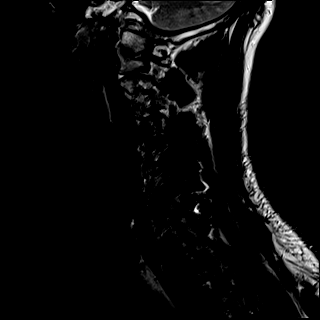
[im 15/15]
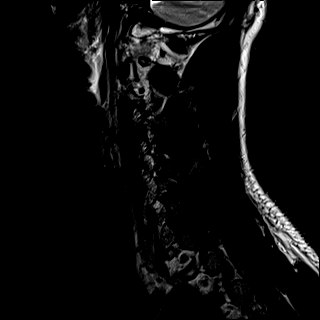

[Series 14: FLAIR · sagittal · 3.0mm · 0.78mm/px · 7 of 15 slices shown]
[im 1/15]
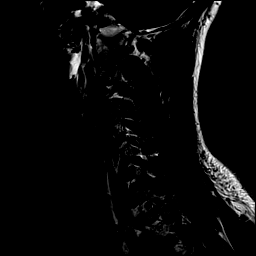
[im 3/15]
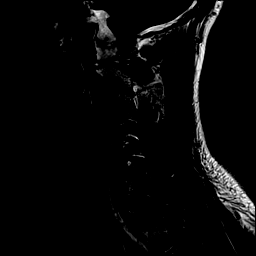
[im 5/15]
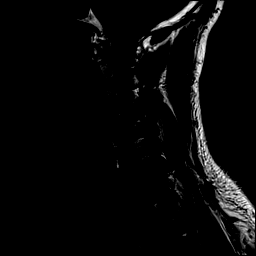
[im 8/15]
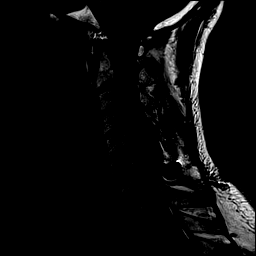
[im 10/15]
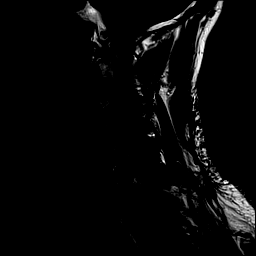
[im 12/15]
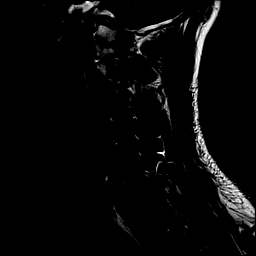
[im 15/15]
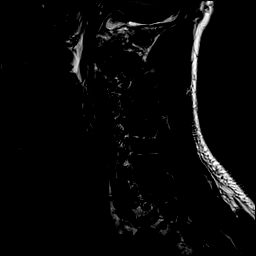

[Series 15: STIR · sagittal · 3.0mm · 0.62mm/px · 7 of 15 slices shown]
[im 1/15]
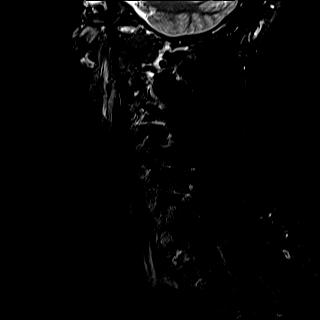
[im 3/15]
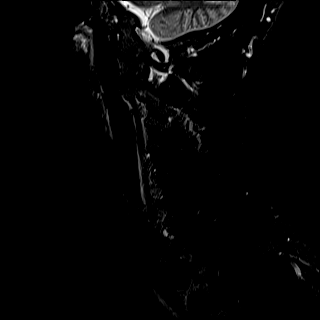
[im 5/15]
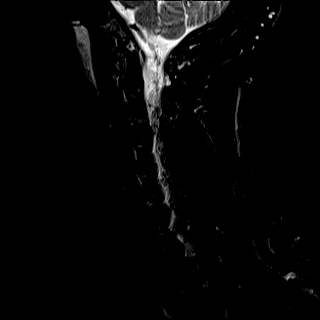
[im 8/15]
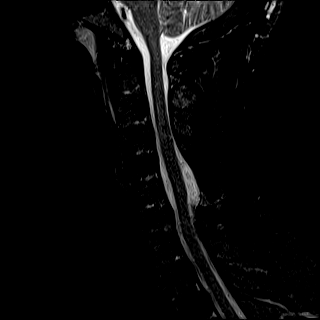
[im 10/15]
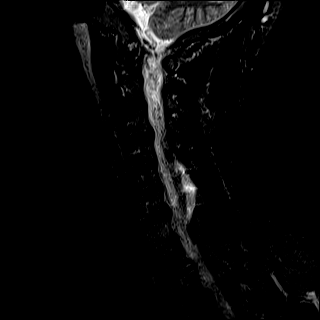
[im 12/15]
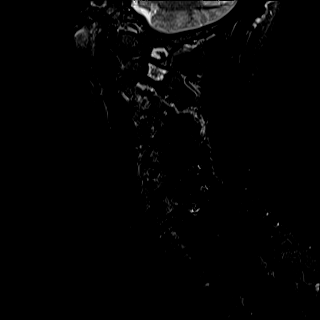
[im 15/15]
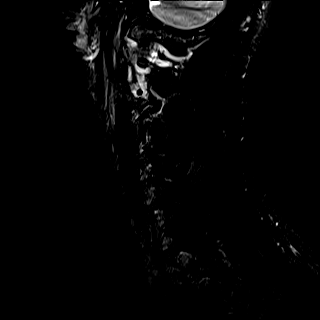

[Series 16: T2 · axial · 3.0mm · 0.70mm/px · z∈[-201,-104]mm · 12 of 30 slices shown (2 of 2)]
[im 1/30]
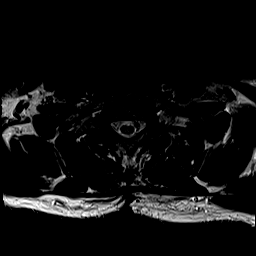
[im 3/30]
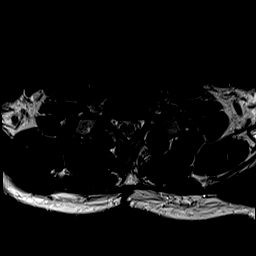
[im 5/30]
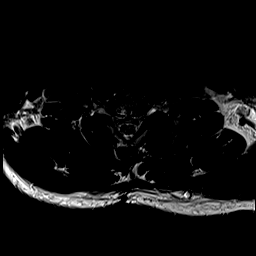
[im 7/30]
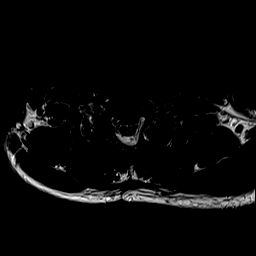
[im 9/30]
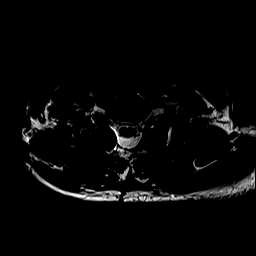
[im 12/30]
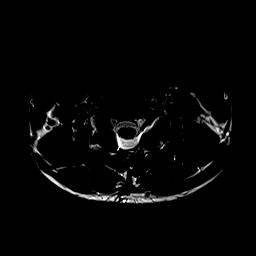
[im 14/30]
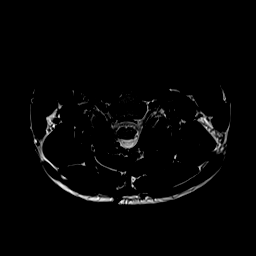
[im 16/30]
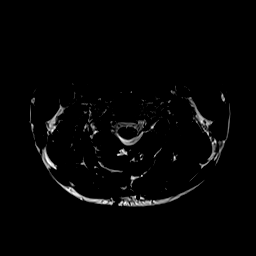
[im 18/30]
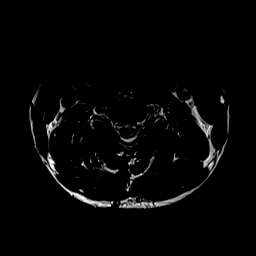
[im 21/30]
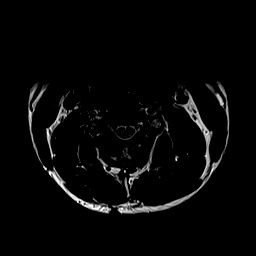
[im 25/30]
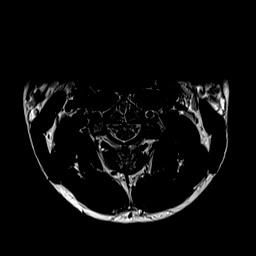
[im 30/30]
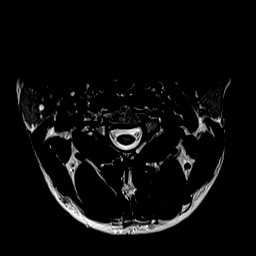

[Series 17: ax mpgr · axial · 3.0mm · 0.35mm/px · z∈[-201,-104]mm · 8 of 30 slices shown]
[im 1/30]
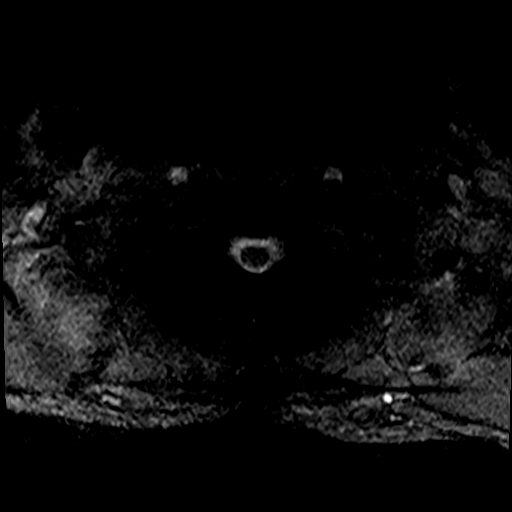
[im 5/30]
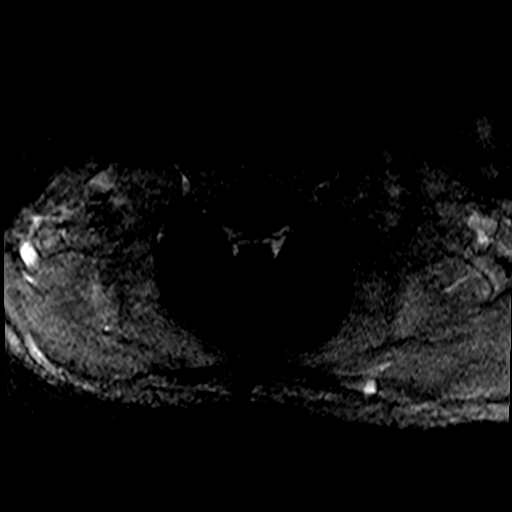
[im 9/30]
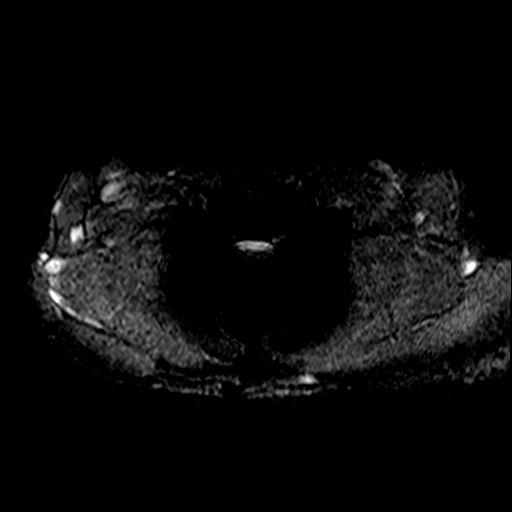
[im 14/30]
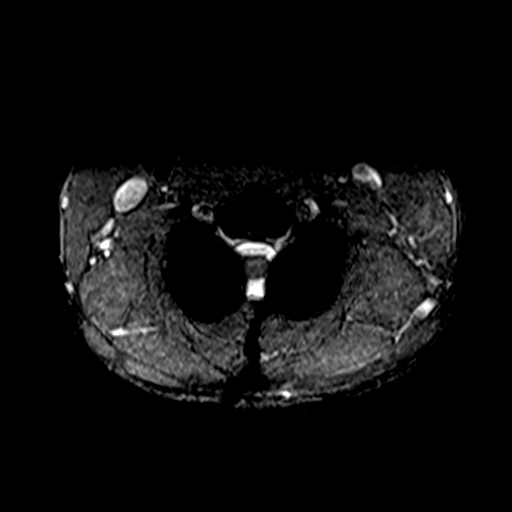
[im 16/30]
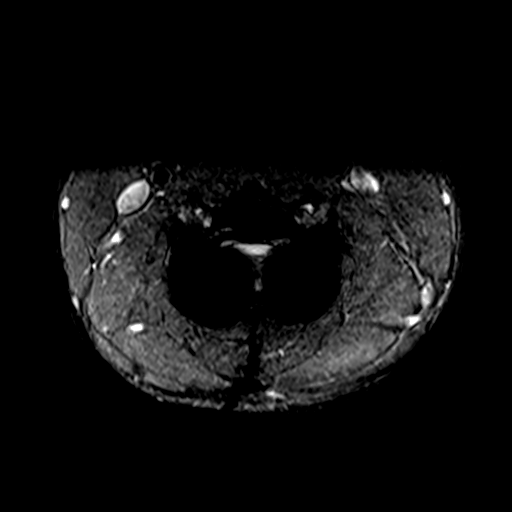
[im 21/30]
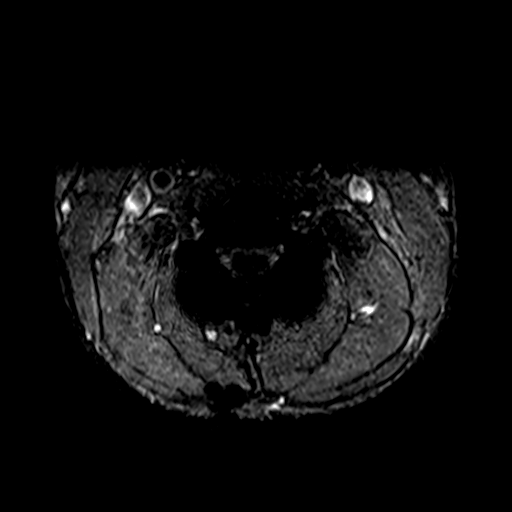
[im 25/30]
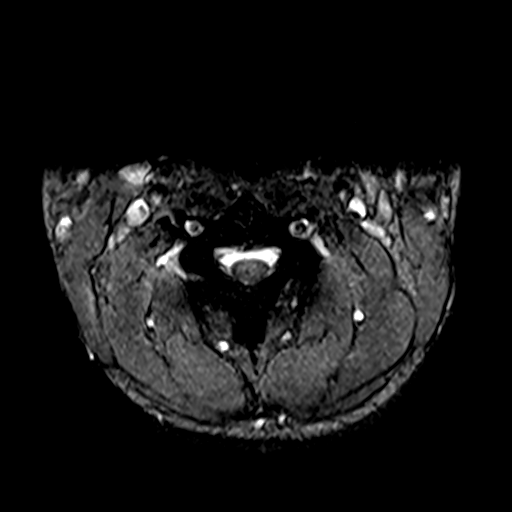
[im 30/30]
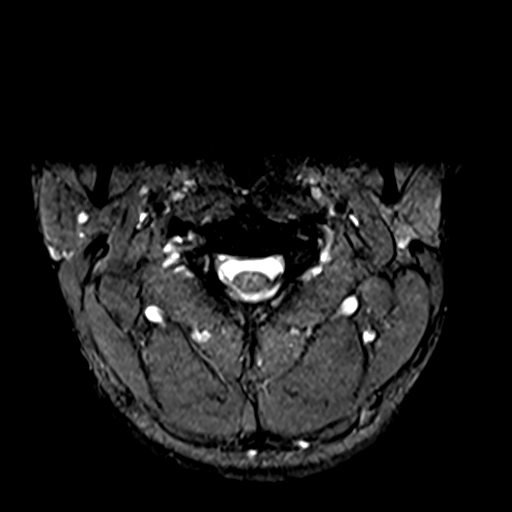

[40 of 48 positions shown; findings below may reference images not displayed]

FINDINGS: Alignment: Normal alignment with straightening of the cervical
lordosis

Vertebrae: Negative for fracture, mass, or spinal infection.

Cord: Normal signal and morphology.

Posterior Fossa, vertebral arteries, paraspinal tissues: Negative

Disc levels:

C2-3: Negative

C3-4: Shallow broad-based disc protrusion and associated spurring.
Mild foraminal narrowing bilaterally. Mild narrowing of the spinal
canal.

C4-5: Posterior hardware fusion. Mild disc degeneration. Posterior
laminectomy. Negative for spinal or foraminal stenosis

C5-6: Posterior hardware fusion with laminectomy. Negative for
stenosis

C6-7: Posterior hardware fusion with laminectomy. No significant
stenosis. Small central disc protrusion.

C7-T1: Mild disc and facet degeneration with spurring. Mild
foraminal narrowing bilaterally.
IMPRESSION: Mild foraminal narrowing bilaterally C3-4 and C7-T1 due to spurring

Posterior pedicle screw and rod fusion C4 through C7 with associated
laminectomy. No significant stenosis at the surgical levels.

## 2022-01-05 IMAGING — MR MR LUMBAR SPINE W/O CM
5 series · 32 of 48 positions shown · non-contrast
Comparison: None.

CLINICAL DATA: Abnormal posture right leg weakness.

EXAM:
MRI LUMBAR SPINE WITHOUT CONTRAST
TECHNIQUE: Multiplanar, multisequence MR imaging of the lumbar spine was
performed. No intravenous contrast was administered.

[Series 5: T2 · sagittal · 4.0mm · 0.81mm/px · 7 of 17 slices shown (1 of 2)]
[im 1/17]
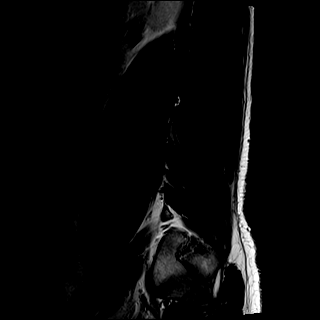
[im 3/17]
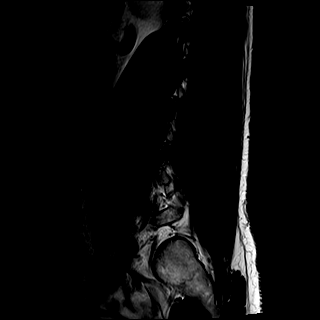
[im 6/17]
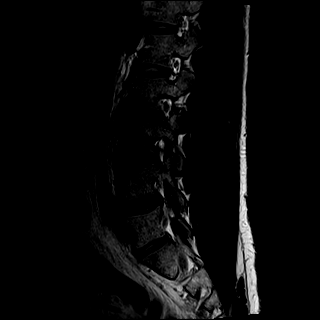
[im 9/17]
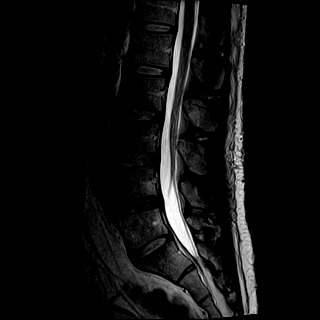
[im 11/17]
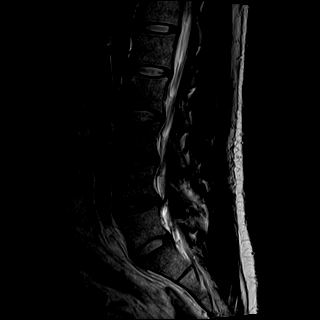
[im 14/17]
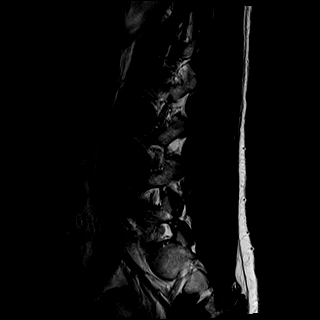
[im 17/17]
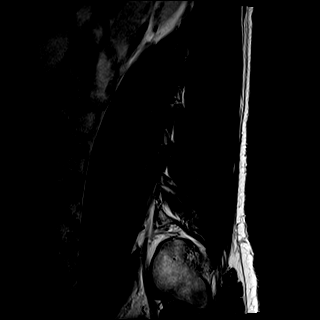

[Series 6: T1 · sagittal · 4.0mm · 0.81mm/px · 7 of 17 slices shown (1 of 2)]
[im 1/17]
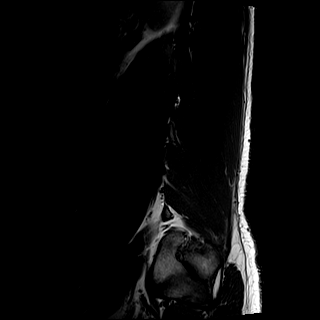
[im 3/17]
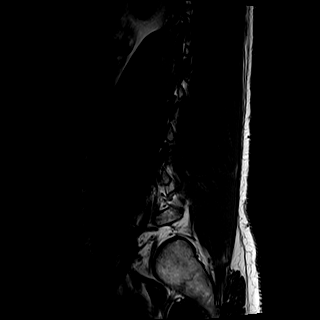
[im 6/17]
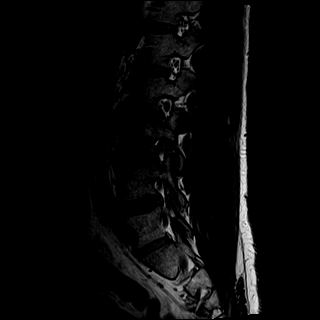
[im 9/17]
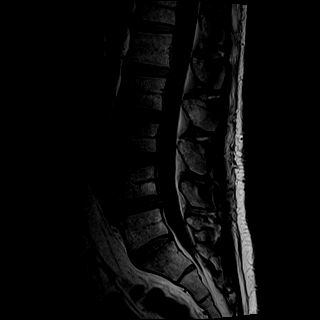
[im 11/17]
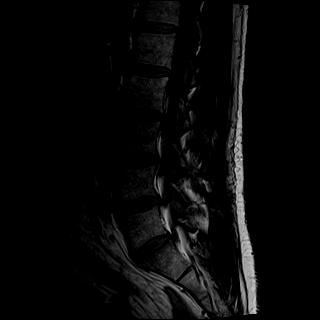
[im 14/17]
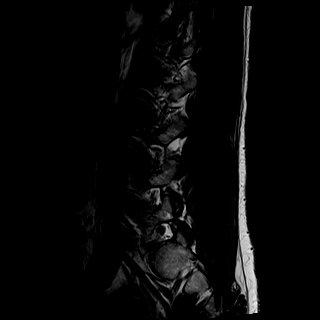
[im 17/17]
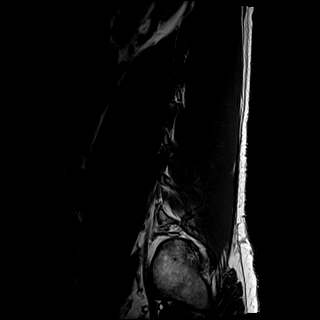

[Series 7: STIR · sagittal · 4.0mm · 0.41mm/px · 2 of 17 slices shown]
[im 1/17]
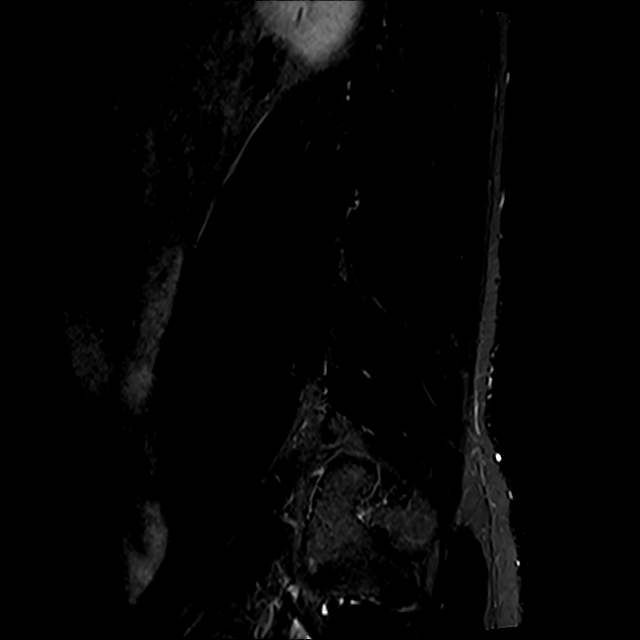
[im 4/17]
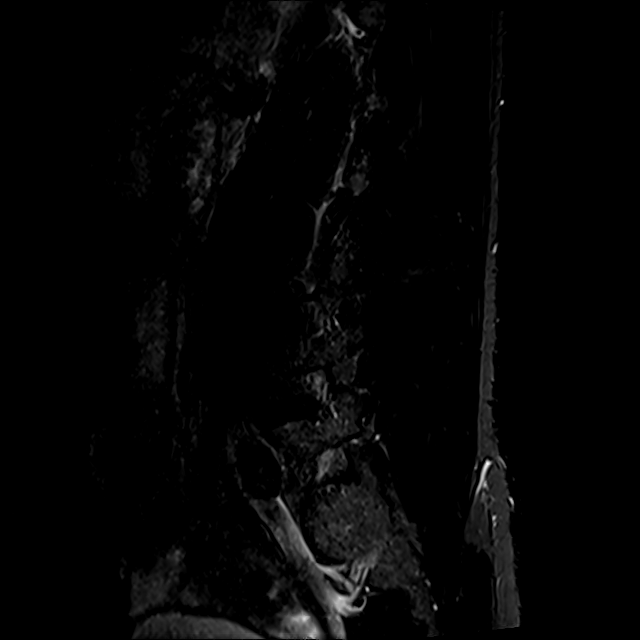

[Series 8: T2 · axial · 4.0mm · 0.78mm/px · z∈[-688,-474]mm · 8 of 38 slices shown (2 of 2)]
[im 1/38]
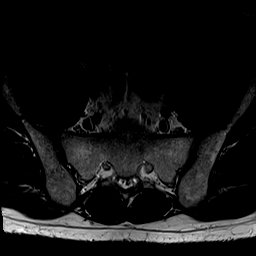
[im 6/38]
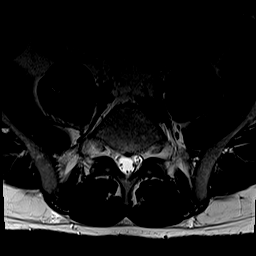
[im 12/38]
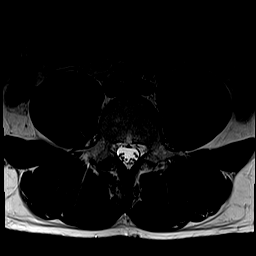
[im 18/38]
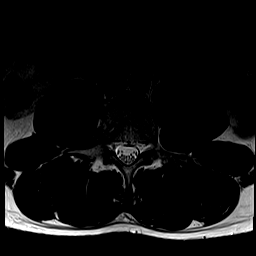
[im 20/38]
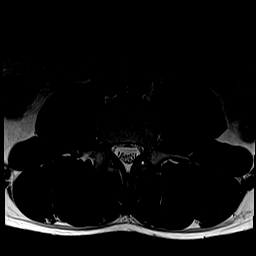
[im 26/38]
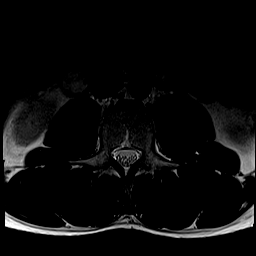
[im 32/38]
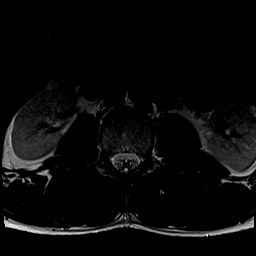
[im 38/38]
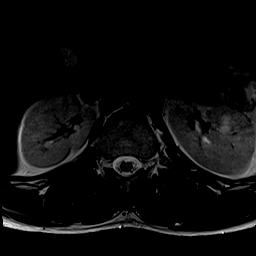

[Series 9: T1 · axial · 4.0mm · 0.39mm/px · z∈[-688,-474]mm · 8 of 38 slices shown (2 of 2)]
[im 1/38]
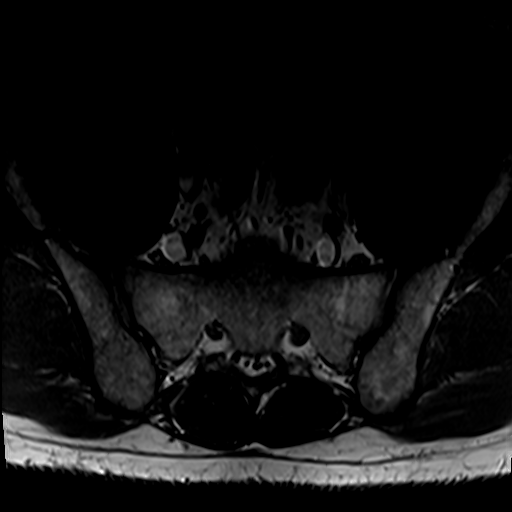
[im 6/38]
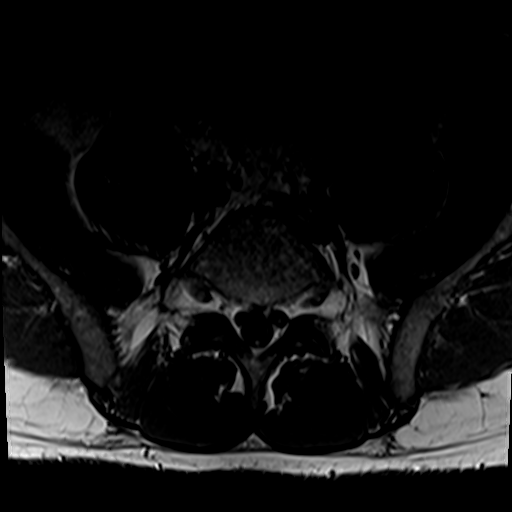
[im 12/38]
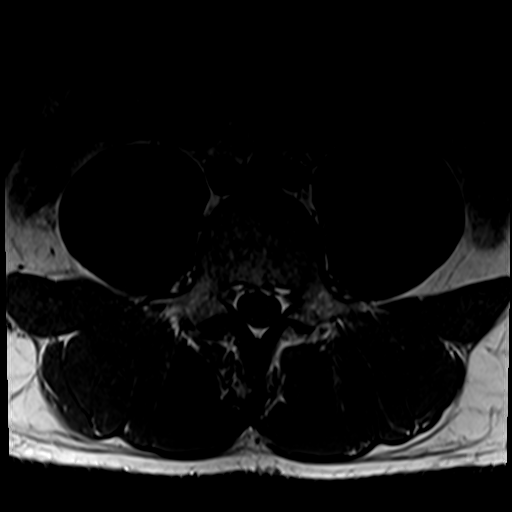
[im 18/38]
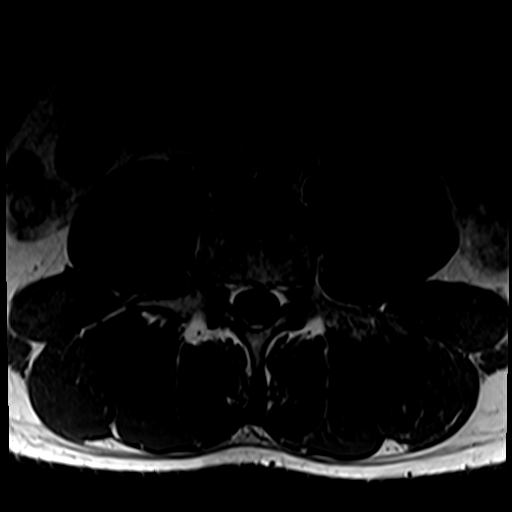
[im 20/38]
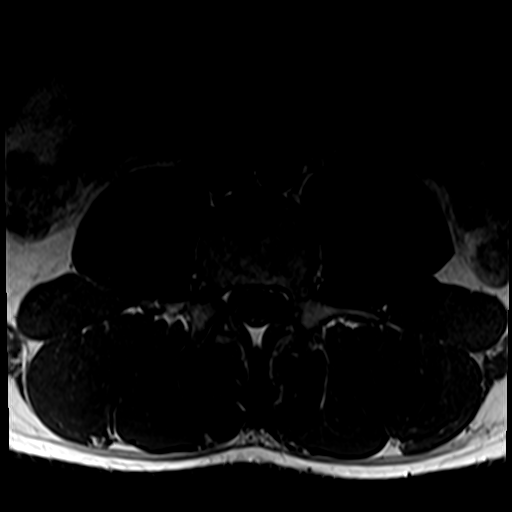
[im 26/38]
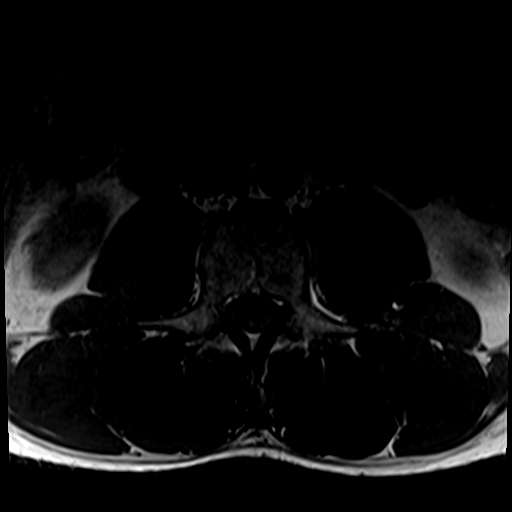
[im 32/38]
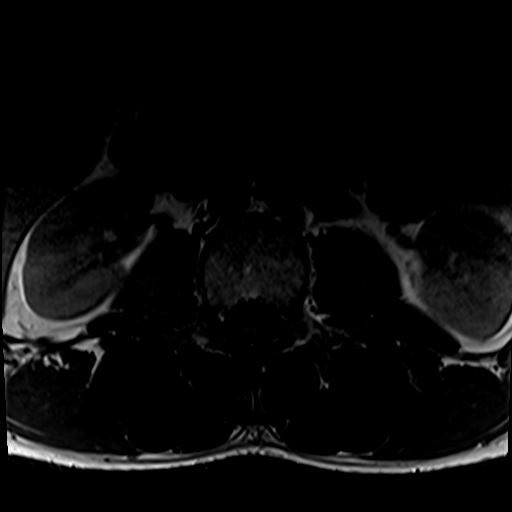
[im 38/38]
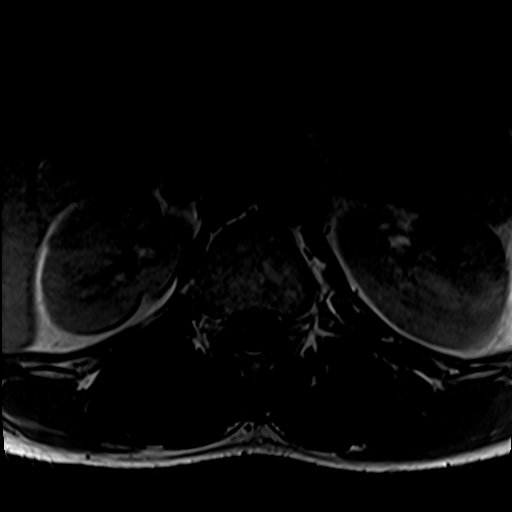

[32 of 48 positions shown; findings below may reference images not displayed]

FINDINGS: Segmentation:  5 lumbar vertebra

Alignment:  Normal

Vertebrae: Normal bone marrow. Negative for fracture, mass, or
spinal infection

Conus medullaris and cauda equina: Conus extends to the L1-2 level.
Conus and cauda equina appear normal.

Paraspinal and other soft tissues: Negative for paraspinous mass or
adenopathy

Disc levels:

L1-2: Negative

L2-3: Negative

L3-4: Mild disc bulging and shallow central disc protrusion.
Negative for stenosis

L4-5: Shallow central disc protrusion. Normal facet joints. No
significant stenosis

L5-S1: Negative
IMPRESSION: Mild lumbar degenerative change without spinal stenosis or neural
impingement.

## 2022-01-05 IMAGING — CR DG CERVICAL SPINE COMPLETE 4+V
6 series · 6 of 6 positions shown · non-contrast
Comparison: None.

CLINICAL DATA: Neck pain.  Prior fusion

EXAM:
CERVICAL SPINE - COMPLETE 4+ VIEW

[c-spine lat]
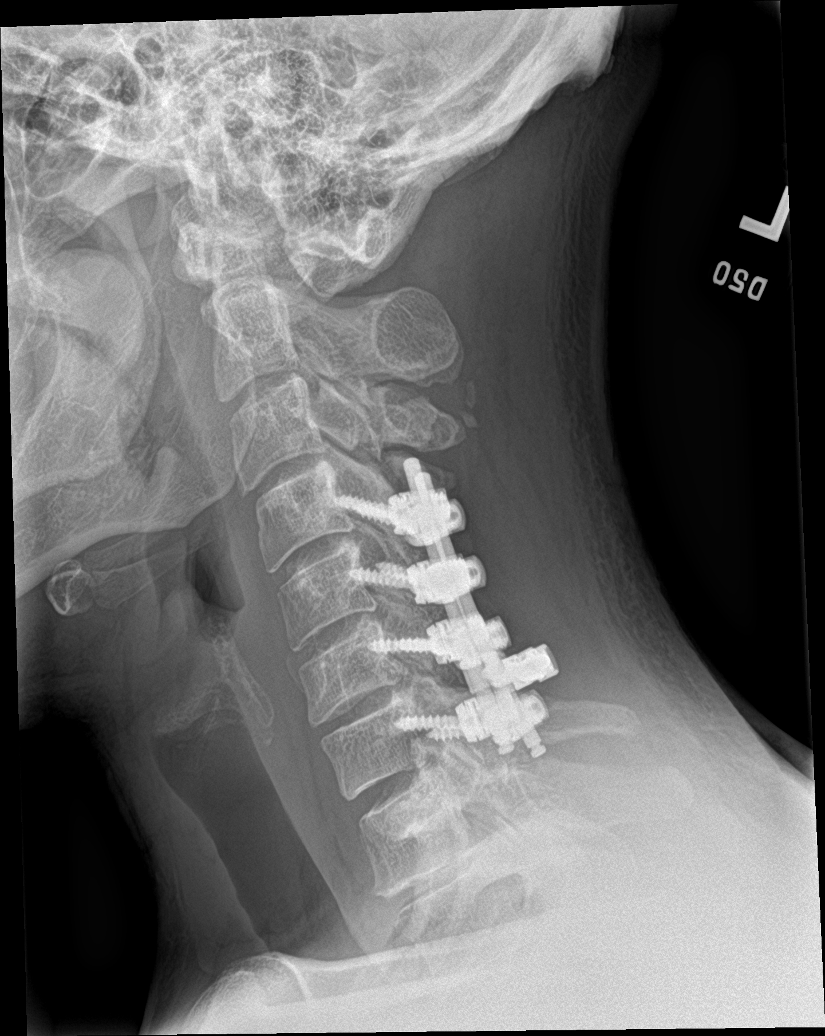

[c-spine obl (1 of 2)]
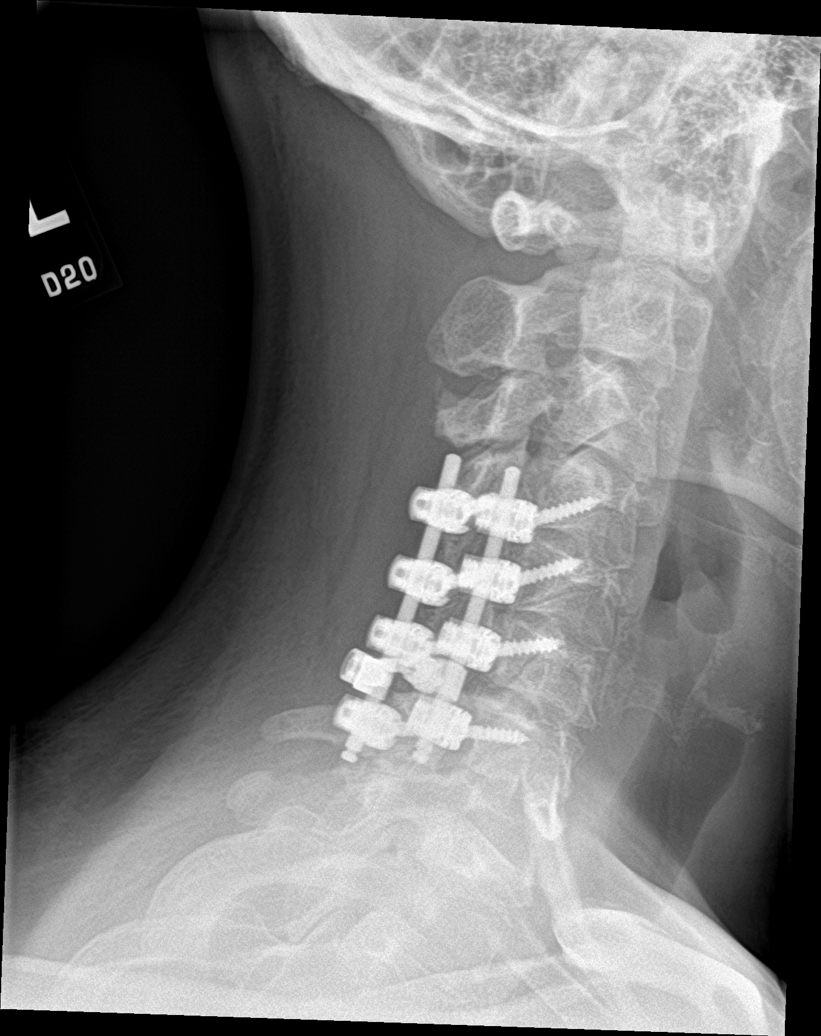

[c-spine obl (2 of 2)]
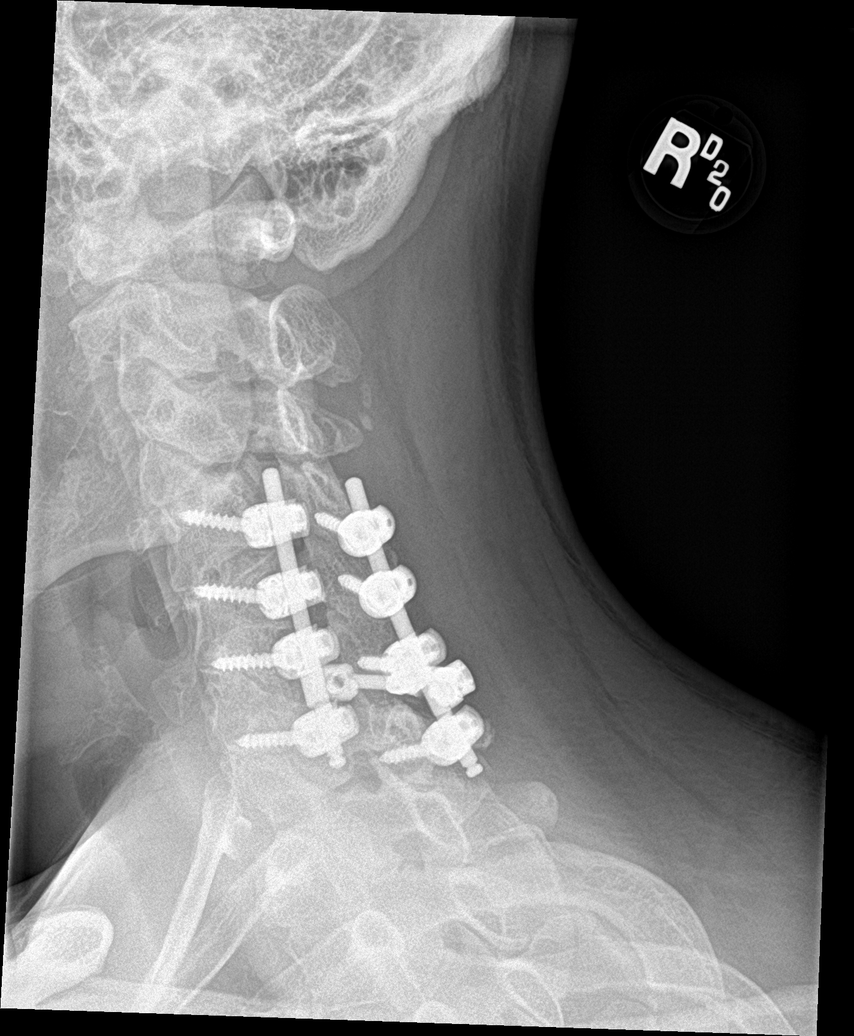

[c-spine ap]
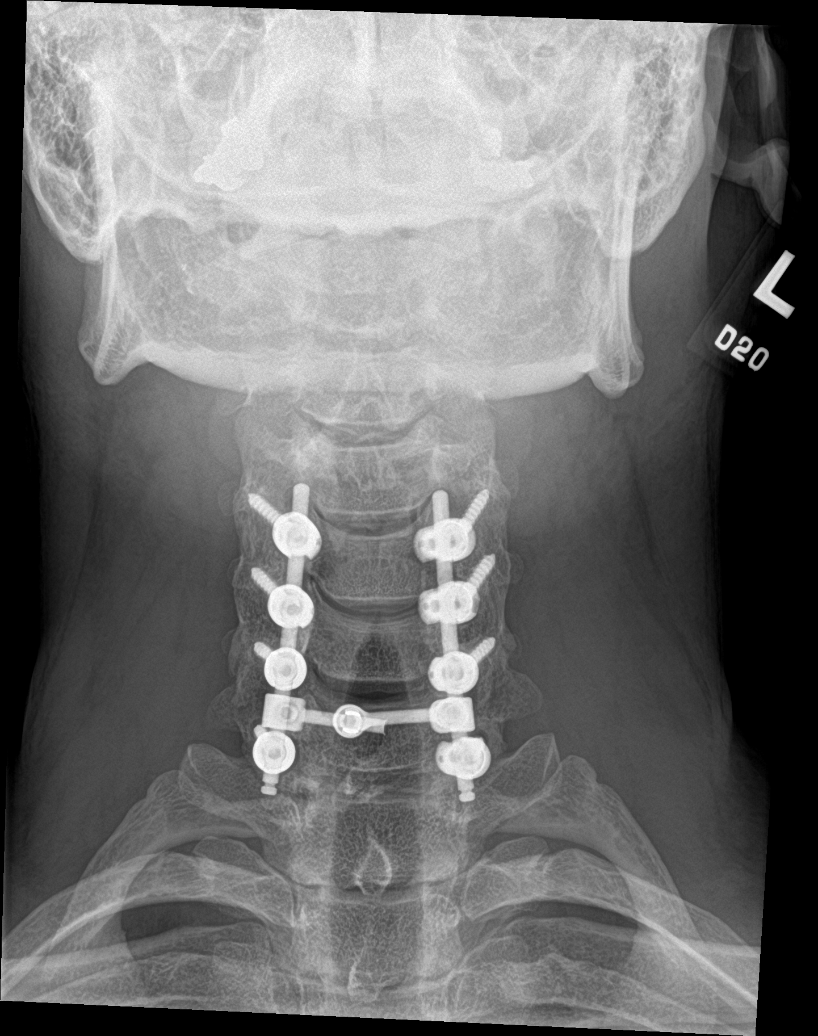

[c-spine open mouth]
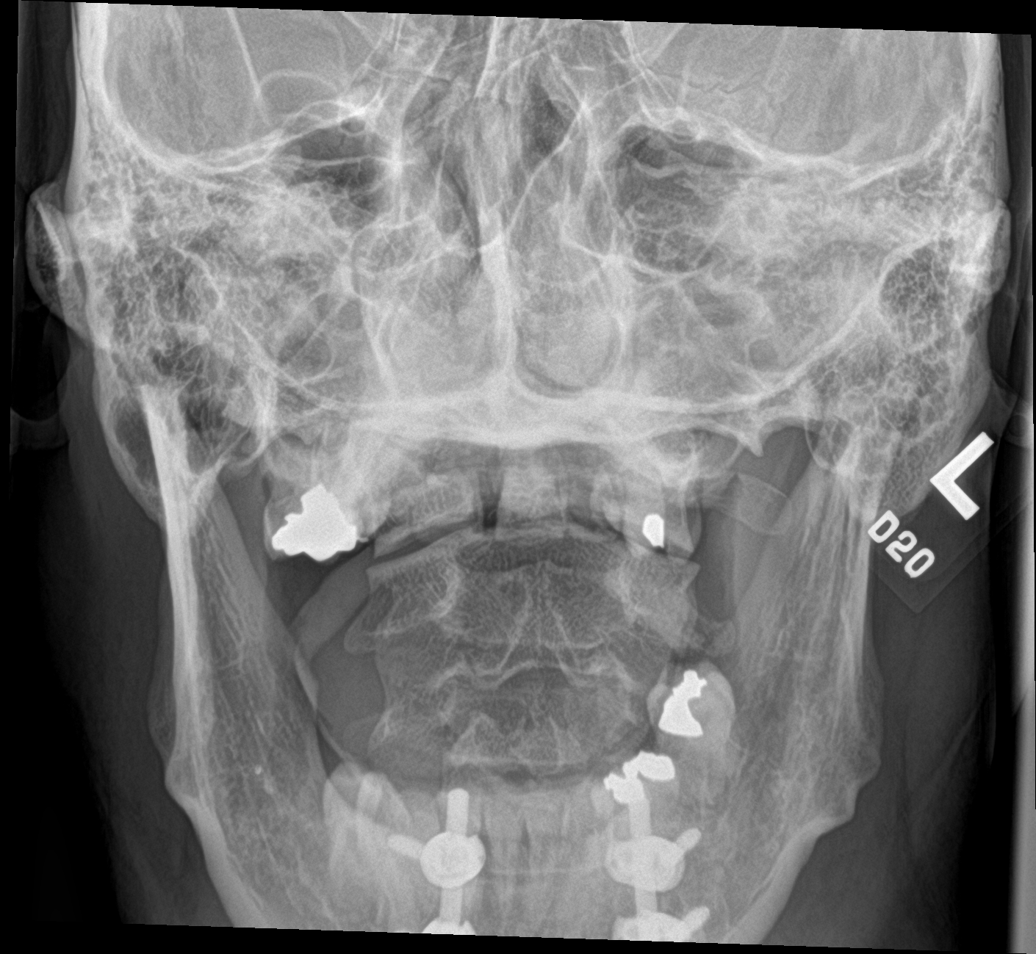

[[person_name]]
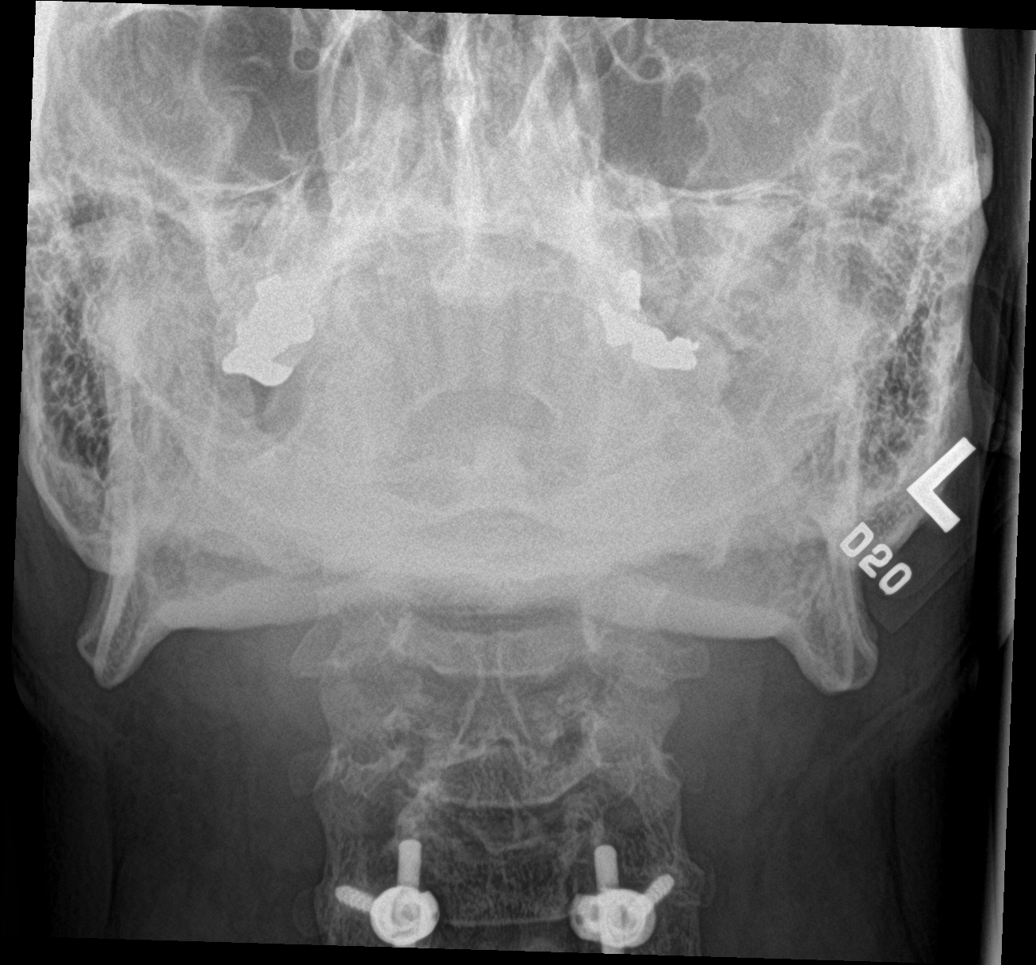

[6 of 6 positions shown; findings below may reference images not displayed]

FINDINGS: There is no evidence of cervical spine fracture or prevertebral soft
tissue swelling. Prior fusion with posterior screw and rod fixation
at C4 through C7. Hardware appears intact and well seated without
perihardware lucency. Non fused disc spaces of the cervical spine
are preserved. Foramina obscured by hardware on oblique views.
Straightening of the cervical lordosis without static listhesis. No
other significant bone abnormalities are identified.
IMPRESSION: 1. No acute findings.
2. Prior posterior fusion at C4 through C7 without evidence of
hardware complication.
3. Straightening of the cervical lordosis without static listhesis.

## 2022-01-05 MED ORDER — ONDANSETRON HCL 4 MG/2ML IJ SOLN
4.0000 mg | Freq: Once | INTRAMUSCULAR | Status: AC
  Administered 2022-01-05: 4 mg via INTRAVENOUS
  Filled 2022-01-05: qty 2

## 2022-01-05 MED ORDER — MORPHINE SULFATE (PF) 4 MG/ML IV SOLN
4.0000 mg | Freq: Once | INTRAVENOUS | Status: AC
  Administered 2022-01-05: 4 mg via INTRAVENOUS
  Filled 2022-01-05: qty 1

## 2022-01-05 MED ORDER — OXYCODONE-ACETAMINOPHEN 5-325 MG PO TABS
1.0000 | ORAL_TABLET | Freq: Once | ORAL | Status: AC
  Administered 2022-01-05: 1 via ORAL
  Filled 2022-01-05: qty 1

## 2022-01-05 NOTE — ED Provider Notes (Signed)
? ?[facility] ?Provider Note ? ? ? Event Date/Time  ? First MD Initiated Contact with Patient 01/05/22 1159   ?  (approximate) ? ? ?History  ? ?Neck Pain and Weakness ? ? ?HPI ? ?Christian Sharp is a 36 y.o. male with a past history of neck surgery and cervical radiculopathy with cervical degenerative disease.  He also has a past history of cocaine abuse.  Patient seen at [facility] and at the [place] and other places as well.  Unable to access records from the [place] currently.  Patient reports she had neck surgery in about 2020 or 2021.  Went to pick up something shortly thereafter felt a pop in his neck and has been having pain intermittently ever since.  He has pain in his neck now from the mid neck down to the upper thoracic spine.  He reports his hardware that he had placed has failed and he needs it redone but he has not done it yet.  He is having intermittent numbness and weakness ever since it failed.  Today is somewhat worse.  He has numbness in the legs and somewhat in the arms.  His right arm and right leg are weaker than the left.  He says he is lost his bowels a couple times as well.  He has no problems with his low back. ? ?  ? ? ?Physical Exam  ? ?Triage Vital Signs: ?ED Triage Vitals  ?Enc Vitals Group  ?   BP 01/05/22 0852 (!) 141/92  ?   Pulse Rate 01/05/22 0852 76  ?   Resp 01/05/22 0852 17  ?   Temp 01/05/22 0852 98.2 ?F (36.8 ?C)  ?   Temp src --   ?   SpO2 01/05/22 0852 98 %  ?   Weight --   ?   Height --   ?   Head Circumference --   ?   Peak Flow --   ?   Pain Score 01/05/22 0851 6  ?   Pain Loc --   ?   Pain Edu? --   ?   Excl. in GC? --   ? ? ?Most recent vital signs: ?Vitals:  ? 01/05/22 1500 01/05/22 1530  ?BP: 117/84 129/88  ?Pulse:  (!) 58  ?Resp:  18  ?Temp:    ?SpO2:  95%  ? ? ? ?General: Awake, no distress.  He is holding himself stiffly ?Neck: Tender in the midline from about C4 downwards to possibly T4.  Possibly slightly lower patient has a surgical scar from the  area of C4 down into the upper thoracic spine. ?CV:  Good peripheral perfusion.  ?Resp:  Normal effort.  ?Abd:  No distention.  ?Extremities: No edema ?Neuro exam patient reports numbness and tingling in the legs and somewhat in the arms.  His right arm is weaker than the left right leg is weaker than the left as well although he can move both against gravity without any difficulty. ? ? ?ED Results / Procedures / Treatments  ? ?Labs ?(all labs ordered are listed, but only abnormal results are displayed) ?Labs Reviewed  ?CBC - Abnormal; Notable for the following components:  ?    Result Value  ? WBC 3.7 (*)   ? All other components within normal limits  ?URINALYSIS, ROUTINE W REFLEX MICROSCOPIC - Abnormal; Notable for the following components:  ? Color, Urine STRAW (*)   ? APPearance CLEAR (*)   ? All other components within  normal limits  ?BASIC METABOLIC PANEL  ?CBG MONITORING, ED  ? ? ? ?EKG ? ? ? ? ?RADIOLOGY ? ?Plain films of the neck show hardware in place without any problems.  I reviewed the films. ? ?PROCEDURES: ? ?Critical Care performed:  ? ?Procedures ? ? ?MEDICATIONS ORDERED IN ED: ?Medications  ?morphine (PF) 4 MG/ML injection 4 mg (4 mg Intravenous Given 01/05/22 1252)  ?ondansetron [facility]) injection 4 mg (4 mg Intravenous Given 01/05/22 1251)  ?morphine (PF) 4 MG/ML injection 4 mg (4 mg Intravenous Given 01/05/22 1407)  ? ? ? ?IMPRESSION / MDM / ASSESSMENT AND PLAN / ED COURSE  ?I reviewed the triage vital signs and the nursing notes. ?Patient has some right-sided weakness on exam.  I am not sure what that is about.  His hardware does look intact on the plain films.  I will get MRI of his head neck and hold back just to make sure were not missing anything.  If this is normal I think he can go and follow-up with neurology.  It has not come back at this point however and I have signed the patient out to the oncoming provider. ? ? ? ? ? ?  ? ? ?FINAL CLINICAL IMPRESSION(S) / ED DIAGNOSES  ? ?Final diagnoses:   ?Neck pain  ?Weakness  ? ? ? ?Rx / DC Orders  ? ?ED Discharge Orders   ? ? None  ? ?  ? ? ? ?Note:  This document was prepared using Dragon voice recognition software and may include unintentional dictation errors. ?  ?Arnaldo Natal, MD ?01/05/22 1601 ? ?

## 2022-01-05 NOTE — ED Notes (Addendum)
Pt transported to MRI 

## 2022-01-05 NOTE — ED Triage Notes (Addendum)
Pt comes with neck pain for three days. Pt states he has hx of  this with an infusion in past.  ? ?Pt also states couple incidents of loosing bowels in the last few weeks. Pt also states numbness and tingling in bilateral lower extremities. ?

## 2022-01-05 NOTE — Discharge Instructions (Addendum)
Your MRIs today did not show any acute compression of your spinal cord.  You do have degenerative changes in your spine.  You should follow-up with neurosurgery.  ? ?I have your brain did show some nonspecific changes in your white matter which could be from underlying atherosclerosis.   ?

## 2023-06-15 ENCOUNTER — Emergency Department: Admit: 2023-06-16 | Payer: PRIVATE HEALTH INSURANCE | Primary: Internal Medicine

## 2023-06-15 DIAGNOSIS — S7002XA Contusion of left hip, initial encounter: Secondary | ICD-10-CM

## 2023-06-15 NOTE — ED Provider Notes (Signed)
 [facility] EMERGENCY DEPT  EMERGENCY DEPARTMENT ENCOUNTER    Patient Name: [name] [name]  MRN: [id number]  Birth Date: 10-27-1985  Provider: Therisa Landry Louder, DO   PCP: Paulina Sari FALCON, MD   Time/Date of evaluation: 10:41 PM EDT on 06/15/23    History of Presenting Illness     Chief Complaint   Patient presents with    Fall              History Provided by: Patient   History is limited by: Nothing    HISTORY Randi):   Mayan Kloepfer is a 37 y.o. male who presents to the emergency department via by EMS complaining of left hip pain. States he was walking, did not see the curb and tripped, landing on the left hip. He reports acute on chronic pain. He has a history of chronic hip pain since childhood. Denies numbness or tingling, knee pain, low back pain, bowel or bladder changes. No meds taken prior to arrival.      Nursing Notes were all reviewed and agreed with or any disagreements were addressed in the HPI.    Past History     PAST MEDICAL HISTORY:  No past medical history on file.    PAST SURGICAL HISTORY:  No past surgical history on file.    FAMILY HISTORY:  No family history on file.    SOCIAL HISTORY:       MEDICATIONS:  No current facility-administered medications for this encounter.     No current outpatient medications on file.       ALLERGIES:  Allergies   Allergen Reactions    Hydrocodone Itching and Nausea And Vomiting    Tramadol Seizure    Penicillins Rash, Hives and Nausea And Vomiting    Trazodone And Nefazodone Hallucinations and Other (See Comments)     nightmares       SOCIAL DETERMINANTS OF HEALTH:  Social Determinants of Health     Tobacco Use: High Risk (10/14/2022)    Received from [facility]    Patient History     Smoking Tobacco Use: Every Day     Smokeless Tobacco Use: Never     Passive Exposure: Not on file   Alcohol Use: Not At Risk (10/10/2022)    Received from [facility]    AUDIT-C     Frequency of Alcohol Consumption: Never     Average Number of Drinks: Patient does not drink     Frequency  of Binge Drinking: Never   Financial Resource Strain: Low Risk  (10/10/2022)    Received from [facility]    Overall Financial Resource Strain (CARDIA)     Difficulty of Paying Living Expenses: Not very hard   Food Insecurity: Not on file   Transportation Needs: Not on file   Physical Activity: Not on file   Stress: Stress Concern Present (10/10/2022)    Received from [facility] - Occupational Stress Questionnaire     Feeling of Stress : To some extent   Social Connections: Unknown (02/07/2022)    Received from [facility]    Social Network     Social Network: Not on file   Intimate Partner Violence: Unknown (01/05/2022)    Received from [facility]    HITS     Physically Hurt: Not on file     Insult or Talk Down To: Not on file     Threaten Physical Harm: Not on file  Scream or Curse: Not on file   Depression: Not at risk (04/15/2021)    Received from [facility] visits prior to 12/04/2022.    PHQ-2     SDOH PHQ2 SCORE: 0   Housing Stability: Not on file   Interpersonal Safety: Not on file   Utilities: Not on file       Review of Systems     Negative except as listed above in HPI.    Physical Exam     Vitals:    06/15/23 2107 06/15/23 2110   BP:  112/64   Pulse: 97    Resp: 16    Temp: 98.2 F (36.8 C)    SpO2: 100%    Weight: 90.7 kg (200 lb)    Height: 1.88 m (6' 2)      Physical Exam  Vitals and nursing note reviewed.   Constitutional:       General: He is not in acute distress.     Appearance: Normal appearance. He is not ill-appearing.   HENT:      Head: Normocephalic and atraumatic.      Nose: Nose normal. No rhinorrhea.      Mouth/Throat:      Mouth: Mucous membranes are moist.      Pharynx: No oropharyngeal exudate or posterior oropharyngeal erythema.   Eyes:      General:         Right eye: No discharge.         Left eye: No discharge.      Extraocular Movements: Extraocular movements intact.      Conjunctiva/sclera: Conjunctivae normal.    Cardiovascular:      Rate and Rhythm: Normal rate and regular rhythm.      Heart sounds: No murmur heard.     No friction rub. No gallop.   Pulmonary:      Effort: Pulmonary effort is normal. No respiratory distress.   Musculoskeletal:         General: Normal range of motion.      Cervical back: Normal range of motion and neck supple. No rigidity.      Comments: No tenderness over the midline C, T or L spine or paraspinal muscles.     LLE:  Skin intact with no lacerations, lesions, erythema or ecchymosis.   There is tenderness over the lateral troch with some mild soft tissue swelling. No bony tenderness over the distal femur, knee, tibia, fibula, lateral or medial malleolus, metatarsals  Able to flex hip to 90 degrees, IR/ER hip without significant pain. Able to fully flex/extend knee. Able to straight leg raise without difficulty.   No knee joint laxity or effusion.   Motor intact: FHL, EHL, TA, GSC  Sensation intact to light touch: Saphenous, Sural, SPN, DPN, tibial nerve distributions  Brisk cap refill all toes  Palpable DP/PT pulses  Compartments are soft and compressible       Lymphadenopathy:      Cervical: No cervical adenopathy.   Skin:     General: Skin is warm and dry.      Findings: No rash.   Neurological:      General: No focal deficit present.      Mental Status: He is alert and oriented to person, place, and time.   Psychiatric:         Mood and Affect: Mood normal.         Behavior: Behavior normal.  Diagnostic Study Results     LABS:  No results found for this or any previous visit (from the past 12 hour(s)).    RADIOLOGIC STUDIES:   Non x-ray images such as CT, Ultrasound and MRI are read by the radiologist. X-ray images are visualized and preliminarily interpreted by the ED Provider with the findings as listed in the ED Course section below.     Interpretation per the Radiologist is listed below, if available at the time of this note:    XR HIP 2-3 VW W PELVIS LEFT   Final Result      1.  No fracture.   2. Chronic abnormality of the left greater trochanter is most compatible with   gluteal insertional tendinosis.      Electronically signed by Marolyn Boroughs          Procedures     Procedures    ED Course     I Ethelda Louder, DO) am the first provider for this patient. Initial assessment performed. I reviewed the vital signs, available nursing notes, past medical history, past surgical history, family history and social history. The patients presenting problems have been discussed, and they are in agreement with the care plan formulated and outlined with them.  I have encouraged them to ask questions as they arise throughout their visit.    RECORDS REVIEWED: Nursing Notes and Old Medical Records    MEDICATIONS ADMINISTERED IN THE ED:  Medications   acetaminophen  (TYLENOL ) tablet 1,000 mg (1,000 mg Oral Given 06/15/23 2155)   ketorolac  (TORADOL ) injection 15 mg (15 mg IntraMUSCular Given 06/15/23 2200)            None    Medical Decision Making     Is this patient to be included in the SEP-1 core measure? No Exclusion criteria - the patient is NOT to be included for SEP-1 Core Measure due to: Infection is not suspected    DDX: contusion, fracture, dislocation, muscle strain, sprain    DISCUSSION:  Severity: This appears to be a mild condition.     37 y.o. male who presents for left hip pain after a mechanical trip and fall.  Vitals on arrival wnl - well appearing, nontoxic and in no acute distress. Appears well hydrated. Exam notable for tenderness over the left lateral troch but NV intact. Treated with IM toradol  and tylenol . Imaging shows chronic changes of the left hip - consistent with patients reported history of pain since childhood. Referral to ortho outpatient placed. Felt improved after ER interventions, ambulating independently. Advised RICE, nsaids/tylenol  for symptom management. Relayed results of XR and understands recommended follow up plan. ER return precautions given.     Critical Care  Time: none    Sedrick Louder, DO    Diagnosis and Disposition     Disposition:   DISPOSITION Decision To Discharge 06/15/2023 10:49:30 PM  Condition at Disposition: Good    DISCHARGE NOTE:  Zyren Sevigny  results have been reviewed with him.  He has been counseled regarding his diagnosis, treatment, and plan.  He verbally conveys understanding and agreement of the signs, symptoms, diagnosis, treatment and prognosis and additionally agrees to follow up as discussed.  He also agrees with the care-plan and conveys that all of his questions have been answered.  I have also provided discharge instructions for him that include: educational information regarding their diagnosis and treatment, and list of reasons why they would want to return to the ED prior to their follow-up appointment, should  his condition change. He has been provided with education for proper emergency department utilization.     CLINICAL IMPRESSION:  1. Contusion of left hip, initial encounter        PLAN:  D/C Home    DISCHARGE MEDICATIONS:  There are no discharge medications for this patient.      DISCONTINUED MEDICATIONS:  There are no discharge medications for this patient.      PATIENT REFERRED TO:  Follow Up with:  Paulina Sari FALCON, MD    Call   As needed    Jyl Norleen ORN, MD  [address] [place]  [address] [place] [address] [place] [address]  [phone]      As needed        I Sedrick Louder, DO, am the primary clinician of record.    Dragon Disclaimer     Please note that this dictation was completed with Dragon, the advertising account planner.  Quite often unanticipated grammatical, syntax, homophones, and other interpretive errors are inadvertently transcribed by the computer software.  Please disregard these errors.  Please excuse any errors that have escaped final proofreading.    Sedrick Louder, DO  (Electronically signed)            Louder Kung B, DO  06/15/23 2251

## 2023-06-15 NOTE — ED Triage Notes (Addendum)
 Pt c/o left hip pain after GLF. Sts he has hip pain. Denies hitting head, thinners, and LOC. EMS sts pt ambulatory to medic. Pt a&O x4

## 2023-06-15 NOTE — Discharge Instructions (Signed)
 You were seen today for your left hip injury. Your XRAY shows some chronic changes as discussed. We have placed referral information for orthopaedics on your discharge paperwork. You can follow up with your primary care doctor or at this office.     For pain we recommend:  1) ibuprofen 600-800mg  every 4-6 hours for the next 72 hours, then as needed  2) tylenol  1000mg  every 8 hours for the next 72 hours, then as needed  3) rest, ice, elevation of your leg

## 2023-06-16 ENCOUNTER — Inpatient Hospital Stay
Admit: 2023-06-16 | Discharge: 2023-06-16 | Disposition: A | Discharge status: Home / Self Care | Payer: PRIVATE HEALTH INSURANCE | Attending: Student in an Organized Health Care Education/Training Program

## 2023-06-16 MED ORDER — KETOROLAC TROMETHAMINE 15 MG/ML IJ SOLN
15 | INTRAMUSCULAR | Status: AC
Start: 2023-06-16 — End: 2023-06-15

## 2023-06-16 MED ORDER — ACETAMINOPHEN 500 MG PO TABS
500 | ORAL | Status: AC
Start: 2023-06-16 — End: 2023-06-15

## 2023-06-16 MED ORDER — KETOROLAC TROMETHAMINE 15 MG/ML IJ SOLN
15 | Freq: Once | INTRAMUSCULAR | Status: DC
Start: 2023-06-16 — End: 2023-06-15

## 2023-06-16 MED ADMIN — acetaminophen (TYLENOL) tablet 1,000 mg: 1000 mg | ORAL | @ 02:00:00 | NDC 00904673061

## 2023-06-16 MED ADMIN — ketorolac (TORADOL) injection 15 mg: 15 mg | INTRAMUSCULAR | @ 02:00:00 | NDC 63323016112

## 2023-06-16 MED FILL — ACETAMINOPHEN 500 MG PO TABS: 500 MG | ORAL | Qty: 2

## 2023-06-16 MED FILL — KETOROLAC TROMETHAMINE 15 MG/ML IJ SOLN: 15 MG/ML | INTRAMUSCULAR | Qty: 1
